# Patient Record
Sex: Female | Born: 1985 | Race: White | Hispanic: No | Marital: Single | Smoking: Current every day smoker
Health system: Southern US, Community
[De-identification: ages and names within clinical notes are randomized; demographics above are authoritative.]

## PROBLEM LIST (undated history)

## (undated) DIAGNOSIS — A749 Chlamydial infection, unspecified: Secondary | ICD-10-CM

## (undated) DIAGNOSIS — F340 Cyclothymic disorder: Secondary | ICD-10-CM

## (undated) DIAGNOSIS — B977 Papillomavirus as the cause of diseases classified elsewhere: Secondary | ICD-10-CM

## (undated) DIAGNOSIS — F419 Anxiety disorder, unspecified: Secondary | ICD-10-CM

## (undated) DIAGNOSIS — R87629 Unspecified abnormal cytological findings in specimens from vagina: Secondary | ICD-10-CM

---
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
## 2007-01-22 ENCOUNTER — Emergency Department (HOSPITAL_COMMUNITY): Admission: EM | Admit: 2007-01-22 | Discharge: 2007-01-22 | Payer: Self-pay | Admitting: Emergency Medicine

## 2007-07-15 ENCOUNTER — Ambulatory Visit (HOSPITAL_COMMUNITY): Admission: RE | Admit: 2007-07-15 | Discharge: 2007-07-15 | Payer: Self-pay | Admitting: Family Medicine

## 2007-08-17 ENCOUNTER — Ambulatory Visit (HOSPITAL_COMMUNITY): Admission: RE | Admit: 2007-08-17 | Discharge: 2007-08-17 | Payer: Self-pay | Admitting: Family Medicine

## 2007-09-07 ENCOUNTER — Ambulatory Visit (HOSPITAL_COMMUNITY): Admission: RE | Admit: 2007-09-07 | Discharge: 2007-09-07 | Payer: Self-pay | Admitting: Family Medicine

## 2007-09-16 ENCOUNTER — Ambulatory Visit (HOSPITAL_COMMUNITY): Admission: RE | Admit: 2007-09-16 | Discharge: 2007-09-16 | Payer: Self-pay | Admitting: Family Medicine

## 2007-11-04 DIAGNOSIS — A749 Chlamydial infection, unspecified: Secondary | ICD-10-CM

## 2007-11-04 HISTORY — DX: Chlamydial infection, unspecified: A74.9

## 2007-12-10 ENCOUNTER — Ambulatory Visit: Payer: Self-pay | Admitting: Physician Assistant

## 2007-12-10 ENCOUNTER — Inpatient Hospital Stay (HOSPITAL_COMMUNITY): Admission: AD | Admit: 2007-12-10 | Discharge: 2007-12-10 | Payer: Self-pay | Admitting: Family Medicine

## 2008-02-06 ENCOUNTER — Inpatient Hospital Stay (HOSPITAL_COMMUNITY): Admission: AD | Admit: 2008-02-06 | Discharge: 2008-02-08 | Payer: Self-pay | Admitting: Obstetrics & Gynecology

## 2008-02-06 ENCOUNTER — Ambulatory Visit: Payer: Self-pay | Admitting: Obstetrics and Gynecology

## 2008-03-06 ENCOUNTER — Emergency Department (HOSPITAL_COMMUNITY): Admission: EM | Admit: 2008-03-06 | Discharge: 2008-03-07 | Payer: Self-pay | Admitting: Emergency Medicine

## 2008-03-07 ENCOUNTER — Inpatient Hospital Stay (HOSPITAL_COMMUNITY): Admission: AD | Admit: 2008-03-07 | Discharge: 2008-03-07 | Payer: Self-pay | Admitting: Gynecology

## 2008-05-14 ENCOUNTER — Inpatient Hospital Stay (HOSPITAL_COMMUNITY): Admission: AD | Admit: 2008-05-14 | Discharge: 2008-05-15 | Payer: Self-pay | Admitting: *Deleted

## 2008-05-14 ENCOUNTER — Ambulatory Visit: Payer: Self-pay | Admitting: *Deleted

## 2008-05-14 ENCOUNTER — Inpatient Hospital Stay (HOSPITAL_COMMUNITY): Admission: EM | Admit: 2008-05-14 | Discharge: 2008-05-14 | Payer: Self-pay | Admitting: Emergency Medicine

## 2009-01-09 ENCOUNTER — Emergency Department (HOSPITAL_COMMUNITY): Admission: EM | Admit: 2009-01-09 | Discharge: 2009-01-09 | Payer: Self-pay | Admitting: Emergency Medicine

## 2009-04-02 ENCOUNTER — Inpatient Hospital Stay (HOSPITAL_COMMUNITY): Admission: AD | Admit: 2009-04-02 | Discharge: 2009-04-02 | Payer: Self-pay | Admitting: Obstetrics & Gynecology

## 2009-04-04 ENCOUNTER — Inpatient Hospital Stay (HOSPITAL_COMMUNITY): Admission: AD | Admit: 2009-04-04 | Discharge: 2009-04-04 | Payer: Self-pay | Admitting: Obstetrics & Gynecology

## 2009-06-03 IMAGING — US US OB COMP LESS 14 WK
1 series · 14 of 28 positions shown · non-contrast
Comparison: none

OBSTETRICAL ULTRASOUND:

 This ultrasound exam was performed in the [HOSPITAL] Ultrasound Department.  The OB US report was generated in the AS system, and faxed to the ordering physician.  This report is also available in [REDACTED] PACS.

[Series 1: us ob comp less 14 wk · 0.18mm/px · 44 acquisitions, 14 frames shown]
[im 2/44]
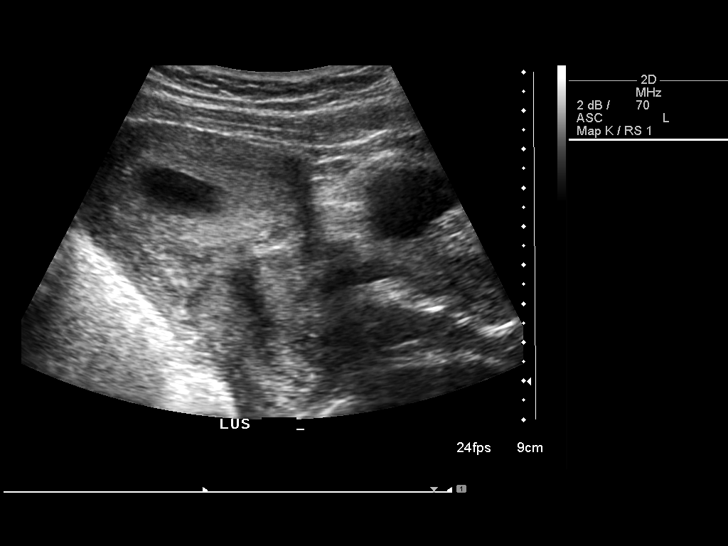
[im 5/44]
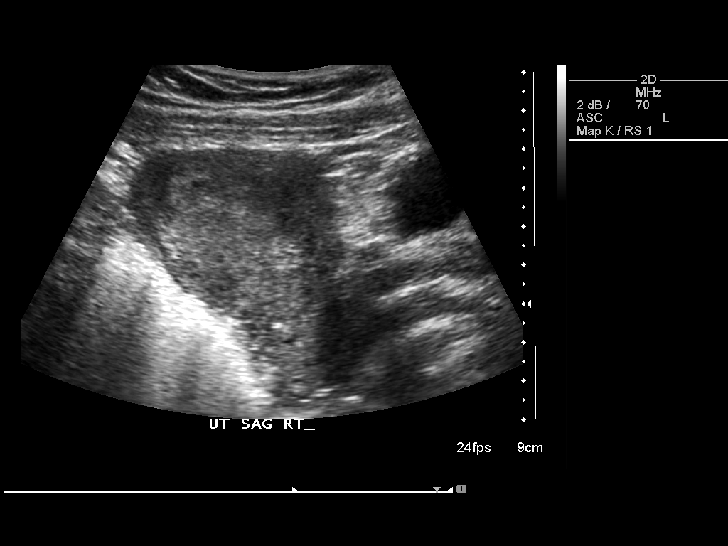
[im 8/44]
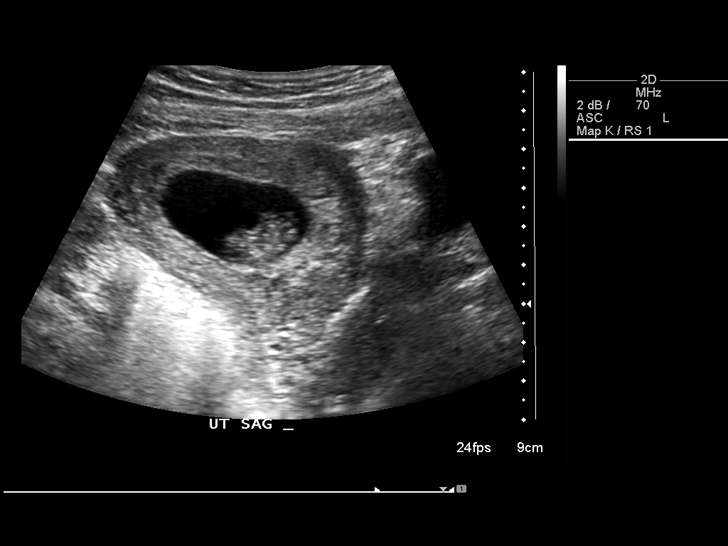
[im 12/44]
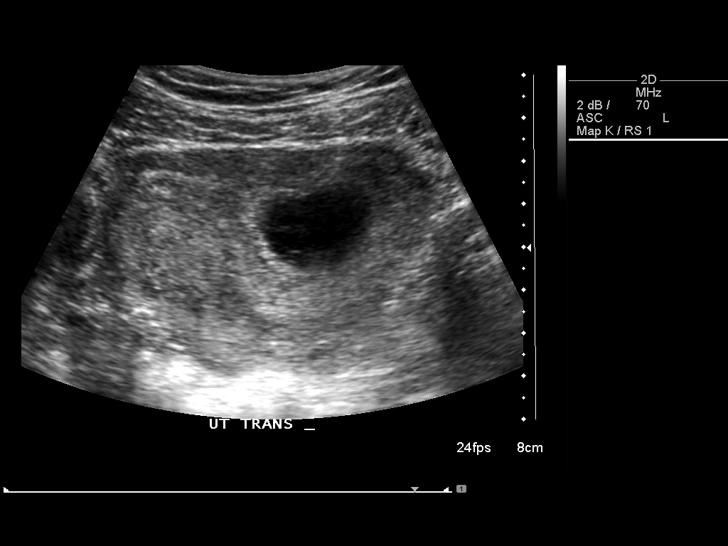
[im 15/44]
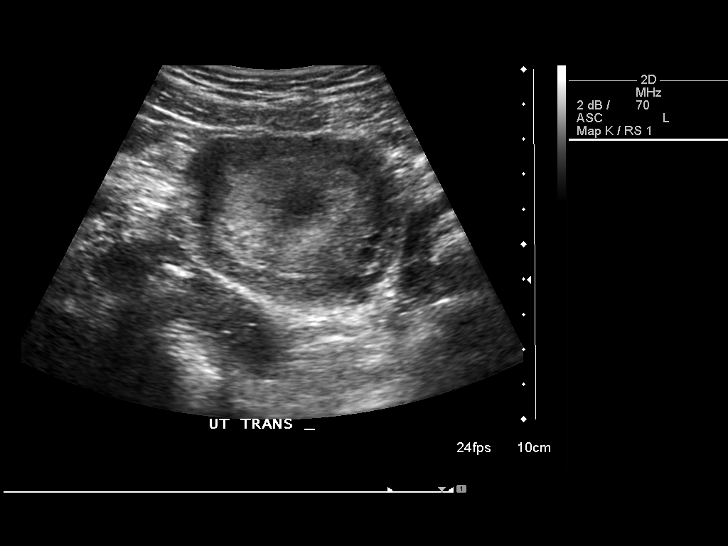
[im 18/44]
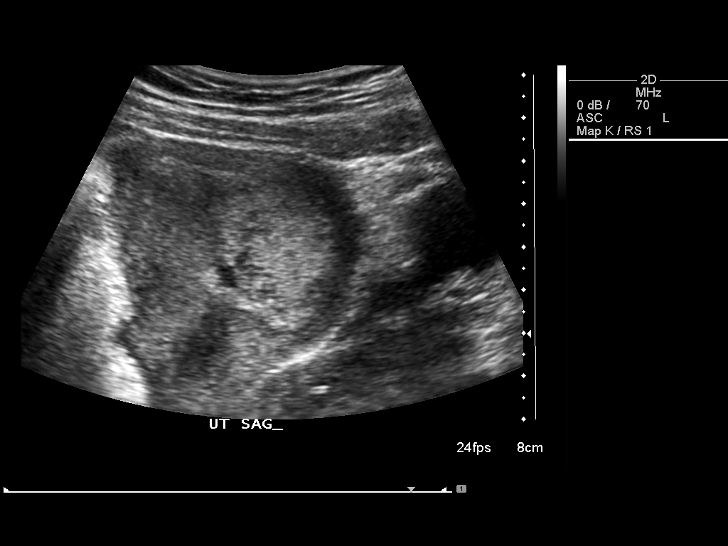
[im 21/44]
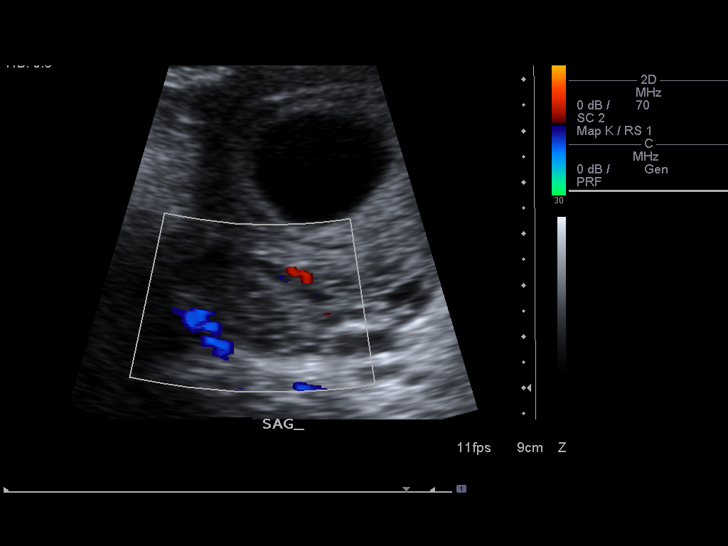
[im 24/44]
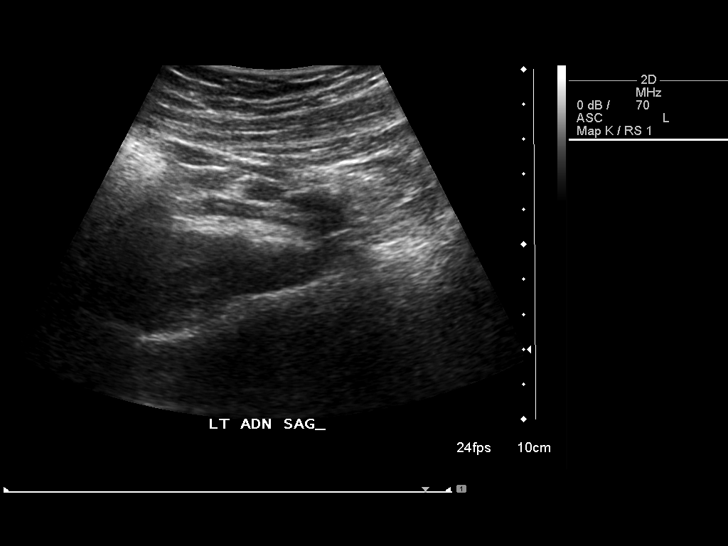
[im 28/44]
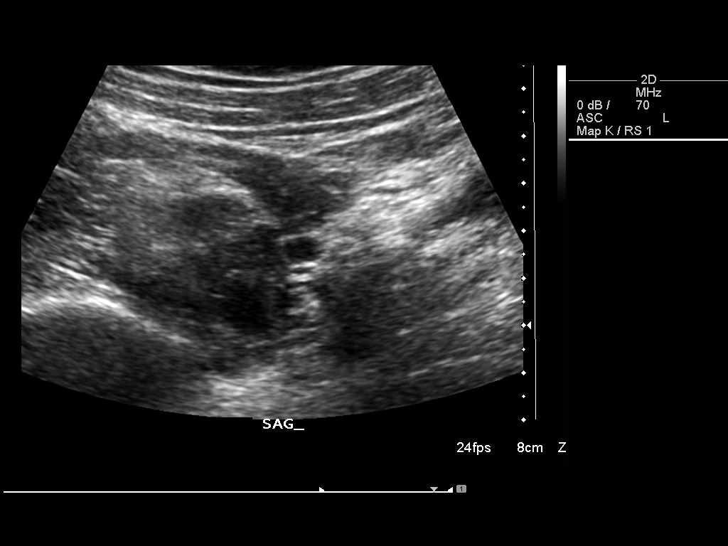
[im 31/44]
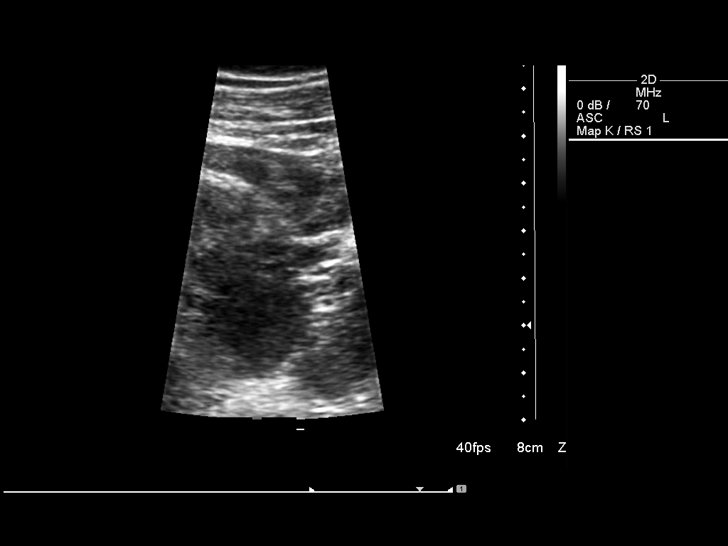
[im 34/44]
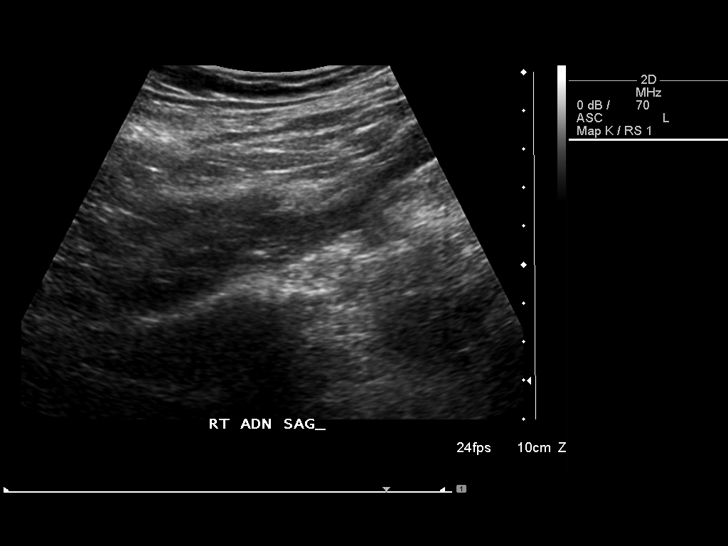
[im 37/44]
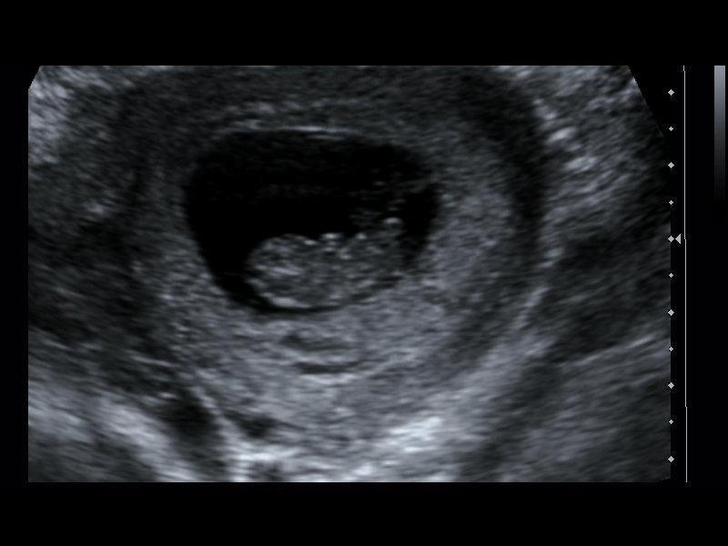
[im 40/44]
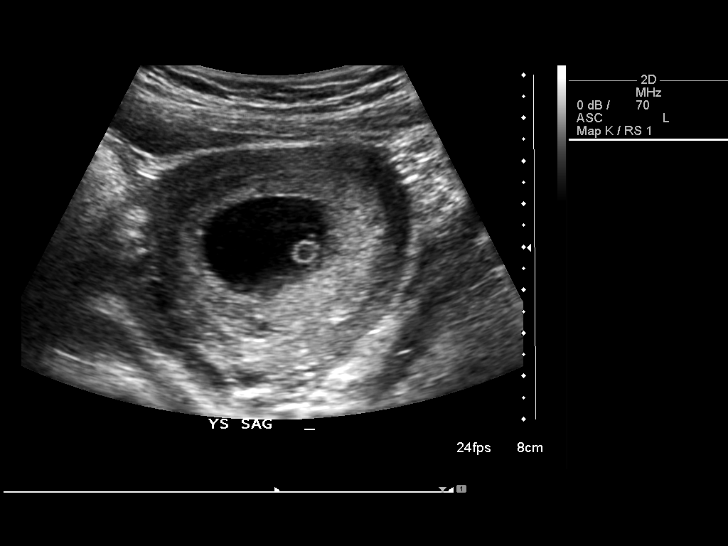
[im 44/44]
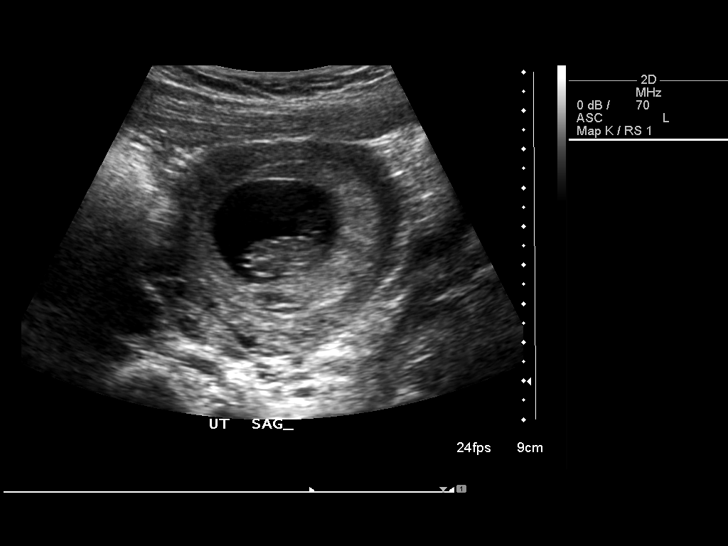

[14 of 28 positions shown; findings below may reference images not displayed]

IMPRESSION: See AS Obstetric US report.

## 2010-01-17 ENCOUNTER — Inpatient Hospital Stay (HOSPITAL_COMMUNITY): Admission: AD | Admit: 2010-01-17 | Discharge: 2010-01-17 | Payer: Self-pay | Admitting: Obstetrics and Gynecology

## 2010-02-21 ENCOUNTER — Inpatient Hospital Stay (HOSPITAL_COMMUNITY): Admission: AD | Admit: 2010-02-21 | Discharge: 2010-02-21 | Payer: Self-pay | Admitting: Obstetrics and Gynecology

## 2010-03-24 ENCOUNTER — Inpatient Hospital Stay (HOSPITAL_COMMUNITY): Admission: AD | Admit: 2010-03-24 | Discharge: 2010-03-26 | Payer: Self-pay | Admitting: Obstetrics and Gynecology

## 2010-05-21 ENCOUNTER — Emergency Department (HOSPITAL_COMMUNITY): Admission: EM | Admit: 2010-05-21 | Discharge: 2010-05-21 | Payer: Self-pay | Admitting: Family Medicine

## 2011-01-05 ENCOUNTER — Emergency Department (HOSPITAL_COMMUNITY)
Admission: EM | Admit: 2011-01-05 | Discharge: 2011-01-05 | Disposition: A | Discharge status: Home / Self Care | Payer: Medicaid Other | Attending: Emergency Medicine | Admitting: Emergency Medicine

## 2011-01-05 ENCOUNTER — Emergency Department (HOSPITAL_COMMUNITY): Payer: Medicaid Other

## 2011-01-05 DIAGNOSIS — R5381 Other malaise: Secondary | ICD-10-CM | POA: Insufficient documentation

## 2011-01-05 DIAGNOSIS — K5289 Other specified noninfective gastroenteritis and colitis: Secondary | ICD-10-CM | POA: Insufficient documentation

## 2011-01-05 DIAGNOSIS — R197 Diarrhea, unspecified: Secondary | ICD-10-CM | POA: Insufficient documentation

## 2011-01-05 DIAGNOSIS — F3289 Other specified depressive episodes: Secondary | ICD-10-CM | POA: Insufficient documentation

## 2011-01-05 DIAGNOSIS — R059 Cough, unspecified: Secondary | ICD-10-CM | POA: Insufficient documentation

## 2011-01-05 DIAGNOSIS — J40 Bronchitis, not specified as acute or chronic: Secondary | ICD-10-CM | POA: Insufficient documentation

## 2011-01-05 DIAGNOSIS — R109 Unspecified abdominal pain: Secondary | ICD-10-CM | POA: Insufficient documentation

## 2011-01-05 DIAGNOSIS — R0982 Postnasal drip: Secondary | ICD-10-CM | POA: Insufficient documentation

## 2011-01-05 DIAGNOSIS — B9789 Other viral agents as the cause of diseases classified elsewhere: Secondary | ICD-10-CM | POA: Insufficient documentation

## 2011-01-05 DIAGNOSIS — F329 Major depressive disorder, single episode, unspecified: Secondary | ICD-10-CM | POA: Insufficient documentation

## 2011-01-05 DIAGNOSIS — R51 Headache: Secondary | ICD-10-CM | POA: Insufficient documentation

## 2011-01-05 DIAGNOSIS — R300 Dysuria: Secondary | ICD-10-CM | POA: Insufficient documentation

## 2011-01-05 DIAGNOSIS — R509 Fever, unspecified: Secondary | ICD-10-CM | POA: Insufficient documentation

## 2011-01-05 DIAGNOSIS — R000 Tachycardia, unspecified: Secondary | ICD-10-CM | POA: Insufficient documentation

## 2011-01-05 DIAGNOSIS — R05 Cough: Secondary | ICD-10-CM | POA: Insufficient documentation

## 2011-01-05 DIAGNOSIS — J3489 Other specified disorders of nose and nasal sinuses: Secondary | ICD-10-CM | POA: Insufficient documentation

## 2011-01-05 DIAGNOSIS — R112 Nausea with vomiting, unspecified: Secondary | ICD-10-CM | POA: Insufficient documentation

## 2011-01-18 LAB — POCT URINALYSIS DIP (DEVICE)
Bilirubin Urine: NEGATIVE
Glucose, UA: NEGATIVE mg/dL
Ketones, ur: NEGATIVE mg/dL
Nitrite: POSITIVE — AB
Protein, ur: 100 mg/dL — AB
Specific Gravity, Urine: 1.02 (ref 1.005–1.030)
Urobilinogen, UA: 1 mg/dL (ref 0.0–1.0)
pH: 7 (ref 5.0–8.0)

## 2011-01-18 LAB — POCT PREGNANCY, URINE: Preg Test, Ur: NEGATIVE

## 2011-01-20 LAB — RPR: RPR Ser Ql: NONREACTIVE

## 2011-01-20 LAB — CBC
HCT: 32.4 % — ABNORMAL LOW (ref 36.0–46.0)
HCT: 36.2 % (ref 36.0–46.0)
Hemoglobin: 11.1 g/dL — ABNORMAL LOW (ref 12.0–15.0)
Hemoglobin: 12.3 g/dL (ref 12.0–15.0)
MCHC: 34 g/dL (ref 30.0–36.0)
MCHC: 34.3 g/dL (ref 30.0–36.0)
MCV: 81.8 fL (ref 78.0–100.0)
MCV: 81.8 fL (ref 78.0–100.0)
Platelets: 153 10*3/uL (ref 150–400)
Platelets: 184 10*3/uL (ref 150–400)
RBC: 3.97 MIL/uL (ref 3.87–5.11)
RBC: 4.43 MIL/uL (ref 3.87–5.11)
RDW: 14.4 % (ref 11.5–15.5)
RDW: 14.7 % (ref 11.5–15.5)
WBC: 11.1 10*3/uL — ABNORMAL HIGH (ref 4.0–10.5)
WBC: 13.6 10*3/uL — ABNORMAL HIGH (ref 4.0–10.5)

## 2011-01-26 LAB — URINE MICROSCOPIC-ADD ON

## 2011-01-26 LAB — URINALYSIS, ROUTINE W REFLEX MICROSCOPIC
Bilirubin Urine: NEGATIVE
Glucose, UA: NEGATIVE mg/dL
Hgb urine dipstick: NEGATIVE
Ketones, ur: NEGATIVE mg/dL
Nitrite: NEGATIVE
Protein, ur: NEGATIVE mg/dL
Specific Gravity, Urine: 1.025 (ref 1.005–1.030)
Urobilinogen, UA: 0.2 mg/dL (ref 0.0–1.0)
pH: 6 (ref 5.0–8.0)

## 2011-01-26 LAB — URINE CULTURE

## 2011-02-10 ENCOUNTER — Emergency Department (HOSPITAL_COMMUNITY)
Admission: EM | Admit: 2011-02-10 | Discharge: 2011-02-10 | Disposition: A | Discharge status: Home / Self Care | Payer: Medicaid Other | Attending: Emergency Medicine | Admitting: Emergency Medicine

## 2011-02-10 DIAGNOSIS — F3289 Other specified depressive episodes: Secondary | ICD-10-CM | POA: Insufficient documentation

## 2011-02-10 DIAGNOSIS — H9209 Otalgia, unspecified ear: Secondary | ICD-10-CM | POA: Insufficient documentation

## 2011-02-10 DIAGNOSIS — F329 Major depressive disorder, single episode, unspecified: Secondary | ICD-10-CM | POA: Insufficient documentation

## 2011-02-10 DIAGNOSIS — R059 Cough, unspecified: Secondary | ICD-10-CM | POA: Insufficient documentation

## 2011-02-10 DIAGNOSIS — R0982 Postnasal drip: Secondary | ICD-10-CM | POA: Insufficient documentation

## 2011-02-10 DIAGNOSIS — J3489 Other specified disorders of nose and nasal sinuses: Secondary | ICD-10-CM | POA: Insufficient documentation

## 2011-02-10 DIAGNOSIS — R070 Pain in throat: Secondary | ICD-10-CM | POA: Insufficient documentation

## 2011-02-10 DIAGNOSIS — R05 Cough: Secondary | ICD-10-CM | POA: Insufficient documentation

## 2011-02-10 DIAGNOSIS — B9789 Other viral agents as the cause of diseases classified elsewhere: Secondary | ICD-10-CM | POA: Insufficient documentation

## 2011-02-10 LAB — RAPID STREP SCREEN (MED CTR MEBANE ONLY): Streptococcus, Group A Screen (Direct): NEGATIVE

## 2011-02-11 LAB — URINE MICROSCOPIC-ADD ON

## 2011-02-11 LAB — CBC
HCT: 39.6 % (ref 36.0–46.0)
Hemoglobin: 14 g/dL (ref 12.0–15.0)
MCV: 85.2 fL (ref 78.0–100.0)
RBC: 4.65 MIL/uL (ref 3.87–5.11)
WBC: 6.1 10*3/uL (ref 4.0–10.5)

## 2011-02-11 LAB — WET PREP, GENITAL: Yeast Wet Prep HPF POC: NONE SEEN

## 2011-02-11 LAB — DIFFERENTIAL
Eosinophils Absolute: 0.3 10*3/uL (ref 0.0–0.7)
Eosinophils Relative: 5 % (ref 0–5)
Lymphs Abs: 2.1 10*3/uL (ref 0.7–4.0)
Monocytes Absolute: 0.6 10*3/uL (ref 0.1–1.0)
Monocytes Relative: 9 % (ref 3–12)

## 2011-02-11 LAB — URINALYSIS, ROUTINE W REFLEX MICROSCOPIC
Glucose, UA: NEGATIVE mg/dL
Nitrite: NEGATIVE
Specific Gravity, Urine: >1.03 — ABNORMAL HIGH (ref 1.005–1.030)
pH: 6 (ref 5.0–8.0)

## 2011-03-13 ENCOUNTER — Emergency Department (HOSPITAL_COMMUNITY)
Admission: EM | Admit: 2011-03-13 | Discharge: 2011-03-13 | Disposition: A | Discharge status: Home / Self Care | Payer: Medicaid Other | Attending: Emergency Medicine | Admitting: Emergency Medicine

## 2011-03-13 DIAGNOSIS — M5430 Sciatica, unspecified side: Secondary | ICD-10-CM | POA: Insufficient documentation

## 2011-03-13 DIAGNOSIS — M545 Low back pain, unspecified: Secondary | ICD-10-CM | POA: Insufficient documentation

## 2011-03-13 DIAGNOSIS — M79609 Pain in unspecified limb: Secondary | ICD-10-CM | POA: Insufficient documentation

## 2011-03-13 DIAGNOSIS — F329 Major depressive disorder, single episode, unspecified: Secondary | ICD-10-CM | POA: Insufficient documentation

## 2011-03-13 DIAGNOSIS — F3289 Other specified depressive episodes: Secondary | ICD-10-CM | POA: Insufficient documentation
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
## 2011-03-31 ENCOUNTER — Emergency Department (HOSPITAL_COMMUNITY)
Admission: EM | Admit: 2011-03-31 | Discharge: 2011-03-31 | Disposition: A | Discharge status: Home / Self Care | Payer: Medicaid Other | Attending: Emergency Medicine | Admitting: Emergency Medicine

## 2011-03-31 DIAGNOSIS — R109 Unspecified abdominal pain: Secondary | ICD-10-CM | POA: Insufficient documentation

## 2011-03-31 DIAGNOSIS — F329 Major depressive disorder, single episode, unspecified: Secondary | ICD-10-CM | POA: Insufficient documentation

## 2011-03-31 DIAGNOSIS — F3289 Other specified depressive episodes: Secondary | ICD-10-CM | POA: Insufficient documentation

## 2011-03-31 DIAGNOSIS — M79609 Pain in unspecified limb: Secondary | ICD-10-CM | POA: Insufficient documentation

## 2011-03-31 DIAGNOSIS — M25559 Pain in unspecified hip: Secondary | ICD-10-CM | POA: Insufficient documentation

## 2011-07-25 LAB — URINALYSIS, ROUTINE W REFLEX MICROSCOPIC
Bilirubin Urine: NEGATIVE
Glucose, UA: NEGATIVE
Hgb urine dipstick: NEGATIVE
Ketones, ur: NEGATIVE
pH: 6.5

## 2011-07-29 LAB — CBC
HCT: 32.9 — ABNORMAL LOW
Hemoglobin: 11.4 — ABNORMAL LOW
MCV: 80.2
RDW: 13.1

## 2011-07-31 LAB — ACETAMINOPHEN LEVEL
Acetaminophen (Tylenol), Serum: 42.5 — ABNORMAL HIGH
Acetaminophen (Tylenol), Serum: 52.2 — ABNORMAL HIGH
Acetaminophen (Tylenol), Serum: <10 — ABNORMAL LOW

## 2011-07-31 LAB — BASIC METABOLIC PANEL
BUN: 10
Calcium: 8.9
Calcium: 9.1
Creatinine, Ser: 0.59
Creatinine, Ser: 0.64
GFR calc Af Amer: >60
GFR calc non Af Amer: >60
GFR calc non Af Amer: >60
Glucose, Bld: 95
Glucose, Bld: 95
Sodium: 138

## 2011-07-31 LAB — CBC
Hemoglobin: 13
RBC: 4.58

## 2011-07-31 LAB — RAPID URINE DRUG SCREEN, HOSP PERFORMED
Amphetamines: NOT DETECTED
Cocaine: NOT DETECTED
Opiates: NOT DETECTED
Tetrahydrocannabinol: NOT DETECTED

## 2011-07-31 LAB — HEPATIC FUNCTION PANEL
ALT: 25
Bilirubin, Direct: 0.1
Indirect Bilirubin: 0.4

## 2011-07-31 LAB — COMPREHENSIVE METABOLIC PANEL
ALT: 28
Alkaline Phosphatase: 85
CO2: 22
GFR calc non Af Amer: >60
Glucose, Bld: 106 — ABNORMAL HIGH
Potassium: 3.2 — ABNORMAL LOW
Sodium: 142

## 2011-07-31 LAB — DIFFERENTIAL
Basophils Relative: 0
Eosinophils Absolute: 0.1
Neutrophils Relative %: 51

## 2011-07-31 LAB — ETHANOL: Alcohol, Ethyl (B): 200 — ABNORMAL HIGH

## 2011-07-31 LAB — SALICYLATE LEVEL: Salicylate Lvl: <4

## 2011-07-31 LAB — URINE CULTURE: Colony Count: NO GROWTH

## 2013-01-16 ENCOUNTER — Encounter (HOSPITAL_COMMUNITY): Payer: Self-pay

## 2013-01-16 ENCOUNTER — Emergency Department (INDEPENDENT_AMBULATORY_CARE_PROVIDER_SITE_OTHER)
Admission: EM | Admit: 2013-01-16 | Discharge: 2013-01-16 | Disposition: A | Discharge status: Home / Self Care | Payer: Self-pay | Source: Home / Self Care | Attending: Emergency Medicine | Admitting: Emergency Medicine

## 2013-01-16 DIAGNOSIS — L509 Urticaria, unspecified: Secondary | ICD-10-CM

## 2013-01-16 MED ORDER — PREDNISONE 20 MG PO TABS: 40.0000 mg | ORAL_TABLET | Freq: Every day | ORAL | Status: DC

## 2013-01-16 MED ORDER — PREDNISONE 20 MG PO TABS: 60.0000 mg | ORAL_TABLET | Freq: Once | ORAL | Status: DC

## 2013-01-16 NOTE — ED Provider Notes (Signed)
History     CSN: [id number]  Arrival date & time 01/16/13  1242   First MD Initiated Contact with Patient 01/16/13 1337      Chief Complaint  Patient presents with  . Rash    (Consider location/radiation/quality/duration/timing/severity/associated sxs/prior treatment) Patient is a 27 y.o. female presenting with rash. The history is provided by the patient.  Rash Location:  Head/neck, hand, shoulder/arm and leg Head/neck rash location:  L ear and L neck Shoulder/arm rash location:  L upper arm, R forearm and L forearm Leg rash location:  R leg Quality: itchiness and redness   Onset quality:  Sudden Duration:  3 days Timing:  Constant Progression:  Worsening Chronicity:  New Context: not animal contact, not chemical exposure, not food, not medications, not new detergent/soap and not plant contact   Relieved by:  Nothing Ineffective treatments:  Antihistamines Associated symptoms: no fever, no shortness of breath, no throat swelling and no tongue swelling     History reviewed. No pertinent past medical history.  History reviewed. No pertinent past surgical history.  No family history on file.  History  Substance Use Topics  . Smoking status: Current Every Day Smoker  . Smokeless tobacco: Not on file  . Alcohol Use: Yes    OB History   Grav Para Term Preterm Abortions TAB SAB Ect Mult Living                  Review of Systems  Constitutional: Negative for fever.  Respiratory: Negative for shortness of breath.   Skin: Positive for rash.  All other systems reviewed and are negative.  Onset of itchy rash 3 days ago that has worsened. No known new exposures. Never had before. Denies any facial swelling or respiratory sx's.  Allergies  Codeine  Home Medications  No current outpatient prescriptions on file.  BP 119/72  Pulse 72  Temp(Src) 98.9 F (37.2 C) (Oral)  Resp 14  SpO2 99%  Physical Exam  Constitutional: She is oriented to person, place, and  time. She appears well-developed and well-nourished.  HENT:  Head: Normocephalic and atraumatic.  Eyes: Conjunctivae are normal.  Neck: Neck supple.  Cardiovascular: Normal rate and regular rhythm.   Pulmonary/Chest: Effort normal and breath sounds normal.  Musculoskeletal: Normal range of motion.  Neurological: She is alert and oriented to person, place, and time.  Skin: Skin is warm and dry.  Raised, erythematous patches (geographic shaped) noted to BUE's, (R) upper leg and (L) neck and (L) ear c/w urticarial rash   Psychiatric: She has a normal mood and affect.    ED Course  Procedures (including critical care time)  Labs Reviewed - No data to display No results found.   No diagnosis found.    MDM  3 day h/o pruritic rash to BUE's (R) upper leg and (L) neck and (L) ear. No new known exposures. Severe itching. Can't sleep at night d/t itching. Will treat w/ Prednisone, Pepcid and Benadryl. Pt to f/u w/ dermatology if rash not improving or worsens.         Leanne Chang, NP 01/16/13 1535

## 2013-01-16 NOTE — ED Notes (Signed)
Patient states woke up with rash approx 3 days ago and has gotten worse since, on arms, neck and left ear area

## 2013-01-16 NOTE — ED Provider Notes (Signed)
Medical screening examination/treatment/procedure(s) were performed by non-physician practitioner and as supervising physician I was immediately available for consultation/collaboration.  Leslee Home, M.D.  Reuben Likes, MD 01/16/13 2035

## 2013-02-27 ENCOUNTER — Emergency Department (HOSPITAL_COMMUNITY)
Admission: EM | Admit: 2013-02-27 | Discharge: 2013-02-27 | Disposition: A | Discharge status: Home / Self Care | Payer: Self-pay | Attending: Emergency Medicine | Admitting: Emergency Medicine

## 2013-02-27 ENCOUNTER — Encounter (HOSPITAL_COMMUNITY): Payer: Self-pay | Admitting: Adult Health

## 2013-02-27 ENCOUNTER — Emergency Department (HOSPITAL_COMMUNITY): Payer: Self-pay

## 2013-02-27 DIAGNOSIS — W010XXA Fall on same level from slipping, tripping and stumbling without subsequent striking against object, initial encounter: Secondary | ICD-10-CM | POA: Insufficient documentation

## 2013-02-27 DIAGNOSIS — Z23 Encounter for immunization: Secondary | ICD-10-CM | POA: Insufficient documentation

## 2013-02-27 DIAGNOSIS — F17200 Nicotine dependence, unspecified, uncomplicated: Secondary | ICD-10-CM | POA: Insufficient documentation

## 2013-02-27 DIAGNOSIS — IMO0002 Reserved for concepts with insufficient information to code with codable children: Secondary | ICD-10-CM | POA: Insufficient documentation

## 2013-02-27 DIAGNOSIS — Z79899 Other long term (current) drug therapy: Secondary | ICD-10-CM | POA: Insufficient documentation

## 2013-02-27 DIAGNOSIS — Y929 Unspecified place or not applicable: Secondary | ICD-10-CM | POA: Insufficient documentation

## 2013-02-27 DIAGNOSIS — S335XXA Sprain of ligaments of lumbar spine, initial encounter: Secondary | ICD-10-CM | POA: Insufficient documentation

## 2013-02-27 DIAGNOSIS — S93401A Sprain of unspecified ligament of right ankle, initial encounter: Secondary | ICD-10-CM

## 2013-02-27 DIAGNOSIS — S39012A Strain of muscle, fascia and tendon of lower back, initial encounter: Secondary | ICD-10-CM

## 2013-02-27 DIAGNOSIS — X500XXA Overexertion from strenuous movement or load, initial encounter: Secondary | ICD-10-CM | POA: Insufficient documentation

## 2013-02-27 DIAGNOSIS — S93409A Sprain of unspecified ligament of unspecified ankle, initial encounter: Secondary | ICD-10-CM | POA: Insufficient documentation

## 2013-02-27 DIAGNOSIS — Y9389 Activity, other specified: Secondary | ICD-10-CM | POA: Insufficient documentation

## 2013-02-27 MED ORDER — TETANUS-DIPHTH-ACELL PERTUSSIS 5-2.5-18.5 LF-MCG/0.5 IM SUSP
0.5000 mL | Freq: Once | INTRAMUSCULAR | Status: AC
  Administered 2013-02-27: 0.5 mL via INTRAMUSCULAR
  Filled 2013-02-27: qty 0.5

## 2013-02-27 MED ORDER — DIAZEPAM 5 MG PO TABS
5.0000 mg | ORAL_TABLET | Freq: Once | ORAL | Status: AC
  Administered 2013-02-27: 5 mg via ORAL
  Filled 2013-02-27: qty 1

## 2013-02-27 MED ORDER — DIAZEPAM 5 MG PO TABS: 5.0000 mg | ORAL_TABLET | Freq: Two times a day (BID) | ORAL | Status: DC
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
## 2013-02-27 NOTE — ED Provider Notes (Signed)
History    This chart was scribed for [name], a non-physician practitioner working with [name], MD by [name], ED Scribe. This patient was seen in room TR10C/TR10C and the patient's care was started at 1750.     CSN: [id number]  Arrival date & time 02/27/13  1446   First MD Initiated Contact with Patient 02/27/13 1717      Chief Complaint  Patient presents with  . Ankle Pain    (Consider location/radiation/quality/duration/timing/severity/associated sxs/prior treatment) The history is provided by the patient.  [name] is a 27 y.o. female who presents to the Emergency Department complaining of superficial abrasion and constant mild swelling of right ankle onset last night after stumbling through a screen door. Pt reports pain is 8/10 in severity with weight bearing and mildly relieved at rest. Pt reports trying neosporin with no relief of symptoms. Pt denies taking any medications to treat pain. Pt reports tetanus is not up to date.  Also complaining of low back pain s/p falling on a slip-and-slide 2 days ago. Pain located lower back described as dull and achy. Denies pain, numbness or tingling down extremities. No loss of control of bowels or bladder or saddle anesthesia.  History reviewed. No pertinent past medical history.  History reviewed. No pertinent past surgical history.  History reviewed. No pertinent family history.  History  Substance Use Topics  . Smoking status: Current Every Day Smoker  . Smokeless tobacco: Not on file  . Alcohol Use: Yes    OB History   Grav Para Term Preterm Abortions TAB SAB Ect Mult Living                  Review of Systems A complete 10 system review of systems was obtained and all systems are negative except as noted in the HPI and PMH.    Allergies  Codeine  Home Medications   Current Outpatient Rx  Name  Route  Sig  Dispense  Refill  . Etonogestrel (IMPLANON [place])   Subcutaneous   Inject into  the skin.         Marland Kitchen ibuprofen (ADVIL,MOTRIN) 200 MG tablet   Oral   Take 400 mg by mouth every 6 (six) hours as needed for headache.           BP 132/90  Pulse 99  Temp(Src) 99 F (37.2 C) (Oral)  Resp 16  SpO2 99%  Physical Exam  Nursing note and vitals reviewed. Constitutional: She is oriented to person, place, and time. She appears well-developed and well-nourished. No distress.  HENT:  Head: Normocephalic and atraumatic.  Eyes: EOM are normal.  Neck: Neck supple. No tracheal deviation present.  Cardiovascular: Normal rate, regular rhythm, normal heart sounds and intact distal pulses.   Pulmonary/Chest: Effort normal. No respiratory distress.  Musculoskeletal: Normal range of motion. She exhibits edema.       Left ankle: She exhibits swelling (mild swelling). She exhibits normal pulse. Tenderness. Lateral malleolus tenderness found. Achilles tendon normal. Achilles tendon exhibits normal Thompson's test results.       Lumbar back: She exhibits tenderness (bilateral paraspinous muscles ).  Neurological: She is alert and oriented to person, place, and time.  Skin: Skin is warm and dry.  2 cm superficial abrasion on lateral malleolus  Psychiatric: She has a normal mood and affect. Her behavior is normal.    ED Course  Procedures (including critical care time) Medications  TDaP (BOOSTRIX) injection 0.5 mL (not administered)  diazepam (VALIUM) tablet 5 mg (not administered)    Labs Reviewed - No data to display Dg Ankle Complete Right  02/27/2013  *RADIOLOGY REPORT*  Clinical Data:  Fall.  Ankle injury  RIGHT ANKLE - COMPLETE 3+ VIEW  Comparison:  None.  Findings:  There is no evidence of fracture, dislocation, or joint effusion.  There is no evidence of arthropathy or other focal bone abnormality.  Soft tissues are unremarkable.  IMPRESSION: Negative.   Original Report Authenticated By: Janeece Riggers, M.D.      1. Ankle sprain, right, initial encounter   2. Back  strain, initial encounter       MDM  27 y/o female with right ankle sprain and low back strain. Ace wrap given for ankle along with crutches. No red flags concerning patient's back pain. No focal neurologic deficits. Neurovascularly intact. No signs of cauda equina. I will give her Valium for muscle spasm. Advised rest, ice and heat for her back, ice and elevation for her ankle. Ibuprofen advised. Return precautions discussed. Patient states understanding of plan and is agreeable.  I personally performed the services described in this documentation, which was scribed in my presence. The recorded information has been reviewed and is accurate.    Trevor Mace, PA-C 02/27/13 [phone]

## 2013-03-01 NOTE — ED Provider Notes (Signed)
Medical screening examination/treatment/procedure(s) were performed by non-physician practitioner and as supervising physician I was immediately available for consultation/collaboration.  [name], MD 03/01/13 [phone]

## 2013-07-31 ENCOUNTER — Encounter (HOSPITAL_COMMUNITY): Payer: Self-pay | Admitting: *Deleted

## 2013-07-31 ENCOUNTER — Emergency Department (HOSPITAL_COMMUNITY)
Admission: EM | Admit: 2013-07-31 | Discharge: 2013-07-31 | Disposition: A | Discharge status: Home / Self Care | Payer: Medicaid Other | Attending: Emergency Medicine | Admitting: Emergency Medicine

## 2013-07-31 ENCOUNTER — Emergency Department (HOSPITAL_COMMUNITY): Payer: Medicaid Other

## 2013-07-31 DIAGNOSIS — S20219A Contusion of unspecified front wall of thorax, initial encounter: Secondary | ICD-10-CM | POA: Insufficient documentation

## 2013-07-31 DIAGNOSIS — Z791 Long term (current) use of non-steroidal anti-inflammatories (NSAID): Secondary | ICD-10-CM | POA: Insufficient documentation

## 2013-07-31 DIAGNOSIS — Y92410 Unspecified street and highway as the place of occurrence of the external cause: Secondary | ICD-10-CM | POA: Insufficient documentation

## 2013-07-31 DIAGNOSIS — Y939 Activity, unspecified: Secondary | ICD-10-CM | POA: Insufficient documentation

## 2013-07-31 DIAGNOSIS — Z79899 Other long term (current) drug therapy: Secondary | ICD-10-CM | POA: Insufficient documentation

## 2013-07-31 DIAGNOSIS — F17200 Nicotine dependence, unspecified, uncomplicated: Secondary | ICD-10-CM | POA: Insufficient documentation

## 2013-07-31 MED ORDER — KETOROLAC TROMETHAMINE 60 MG/2ML IM SOLN
60.0000 mg | Freq: Once | INTRAMUSCULAR | Status: AC
  Administered 2013-07-31: 60 mg via INTRAMUSCULAR
  Filled 2013-07-31: qty 2

## 2013-07-31 MED ORDER — NAPROXEN 500 MG PO TABS: 500.0000 mg | ORAL_TABLET | Freq: Two times a day (BID) | ORAL | Status: DC
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
## 2014-06-19 ENCOUNTER — Inpatient Hospital Stay (HOSPITAL_COMMUNITY)
Admission: AD | Admit: 2014-06-19 | Discharge: 2014-06-19 | Disposition: A | Discharge status: Home / Self Care | Payer: Medicaid Other | Source: Ambulatory Visit | Attending: Family Medicine | Admitting: Family Medicine

## 2014-06-19 ENCOUNTER — Encounter (HOSPITAL_COMMUNITY): Payer: Self-pay | Admitting: *Deleted

## 2014-06-19 DIAGNOSIS — Z975 Presence of (intrauterine) contraceptive device: Secondary | ICD-10-CM

## 2014-06-19 DIAGNOSIS — N938 Other specified abnormal uterine and vaginal bleeding: Secondary | ICD-10-CM | POA: Diagnosis present

## 2014-06-19 DIAGNOSIS — N925 Other specified irregular menstruation: Secondary | ICD-10-CM | POA: Insufficient documentation

## 2014-06-19 DIAGNOSIS — Z309 Encounter for contraceptive management, unspecified: Secondary | ICD-10-CM

## 2014-06-19 DIAGNOSIS — N921 Excessive and frequent menstruation with irregular cycle: Secondary | ICD-10-CM

## 2014-06-19 DIAGNOSIS — N949 Unspecified condition associated with female genital organs and menstrual cycle: Secondary | ICD-10-CM | POA: Insufficient documentation

## 2014-06-19 HISTORY — DX: Unspecified abnormal cytological findings in specimens from vagina: R87.629

## 2014-06-19 HISTORY — DX: Chlamydial infection, unspecified: A74.9

## 2014-06-19 HISTORY — DX: Papillomavirus as the cause of diseases classified elsewhere: B97.7

## 2014-06-19 LAB — URINALYSIS, ROUTINE W REFLEX MICROSCOPIC
Bilirubin Urine: NEGATIVE
GLUCOSE, UA: NEGATIVE mg/dL
Ketones, ur: NEGATIVE mg/dL
LEUKOCYTES UA: NEGATIVE
Nitrite: NEGATIVE
Protein, ur: NEGATIVE mg/dL
SPECIFIC GRAVITY, URINE: 1.025 (ref 1.005–1.030)
Urobilinogen, UA: 0.2 mg/dL (ref 0.0–1.0)
pH: 6 (ref 5.0–8.0)

## 2014-06-19 LAB — POCT PREGNANCY, URINE: PREG TEST UR: NEGATIVE

## 2014-06-19 LAB — CBC
HEMATOCRIT: 40.4 % (ref 36.0–46.0)
HEMOGLOBIN: 14 g/dL (ref 12.0–15.0)
MCH: 29.9 pg (ref 26.0–34.0)
MCHC: 34.7 g/dL (ref 30.0–36.0)
MCV: 86.1 fL (ref 78.0–100.0)
Platelets: 228 10*3/uL (ref 150–400)
RBC: 4.69 MIL/uL (ref 3.87–5.11)
RDW: 13 % (ref 11.5–15.5)
WBC: 10 10*3/uL (ref 4.0–10.5)

## 2014-06-19 LAB — URINE MICROSCOPIC-ADD ON
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
## 2014-06-19 NOTE — Discharge Instructions (Signed)
Contraception Choices Contraception (birth control) is the use of any methods or devices to prevent pregnancy. Below are some methods to help avoid pregnancy. HORMONAL METHODS   Contraceptive implant. This is a thin, plastic tube containing progesterone hormone. It does not contain estrogen hormone. Your health care provider inserts the tube in the inner part of the upper arm. The tube can remain in place for up to 3 years. After 3 years, the implant must be removed. The implant prevents the ovaries from releasing an egg (ovulation), thickens the cervical mucus to prevent sperm from entering the uterus, and thins the lining of the inside of the uterus.  Progesterone-only injections. These injections are given every 3 months by your health care provider to prevent pregnancy. This synthetic progesterone hormone stops the ovaries from releasing eggs. It also thickens cervical mucus and changes the uterine lining. This makes it harder for sperm to survive in the uterus.  Birth control pills. These pills contain estrogen and progesterone hormone. They work by preventing the ovaries from releasing eggs (ovulation). They also cause the cervical mucus to thicken, preventing the sperm from entering the uterus. Birth control pills are prescribed by a health care provider.Birth control pills can also be used to treat heavy periods.  Minipill. This type of birth control pill contains only the progesterone hormone. They are taken every day of each month and must be prescribed by your health care provider.  Birth control patch. The patch contains hormones similar to those in birth control pills. It must be changed once a week and is prescribed by a health care provider.  Vaginal ring. The ring contains hormones similar to those in birth control pills. It is left in the vagina for 3 weeks, removed for 1 week, and then a new one is put back in place. The patient must be comfortable inserting and removing the ring  from the vagina.A health care provider's prescription is necessary.  Emergency contraception. Emergency contraceptives prevent pregnancy after unprotected sexual intercourse. This pill can be taken right after sex or up to 5 days after unprotected sex. It is most effective the sooner you take the pills after having sexual intercourse. Most emergency contraceptive pills are available without a prescription. Check with your pharmacist. Do not use emergency contraception as your only form of birth control. BARRIER METHODS   Female condom. This is a thin sheath (latex or rubber) that is worn over the penis during sexual intercourse. It can be used with spermicide to increase effectiveness.  Female condom. This is a soft, loose-fitting sheath that is put into the vagina before sexual intercourse.  Diaphragm. This is a soft, latex, dome-shaped barrier that must be fitted by a health care provider. It is inserted into the vagina, along with a spermicidal jelly. It is inserted before intercourse. The diaphragm should be left in the vagina for 6 to 8 hours after intercourse.  Cervical cap. This is a round, soft, latex or plastic cup that fits over the cervix and must be fitted by a health care provider. The cap can be left in place for up to 48 hours after intercourse.  Sponge. This is a soft, circular piece of polyurethane foam. The sponge has spermicide in it. It is inserted into the vagina after wetting it and before sexual intercourse.  Spermicides. These are chemicals that kill or block sperm from entering the cervix and uterus. They come in the form of creams, jellies, suppositories, foam, or tablets. They do not require a   prescription. They are inserted into the vagina with an applicator before having sexual intercourse. The process must be repeated every time you have sexual intercourse. INTRAUTERINE CONTRACEPTION  Intrauterine device (IUD). This is a T-shaped device that is put in a woman's uterus  during a menstrual period to prevent pregnancy. There are 2 types:  Copper IUD. This type of IUD is wrapped in copper wire and is placed inside the uterus. Copper makes the uterus and fallopian tubes produce a fluid that kills sperm. It can stay in place for 10 years.  Hormone IUD. This type of IUD contains the hormone progestin (synthetic progesterone). The hormone thickens the cervical mucus and prevents sperm from entering the uterus, and it also thins the uterine lining to prevent implantation of a fertilized egg. The hormone can weaken or kill the sperm that get into the uterus. It can stay in place for 3-5 years, depending on which type of IUD is used. PERMANENT METHODS OF CONTRACEPTION  Female tubal ligation. This is when the woman's fallopian tubes are surgically sealed, tied, or blocked to prevent the egg from traveling to the uterus.  Hysteroscopic sterilization. This involves placing a small coil or insert into each fallopian tube. Your doctor uses a technique called hysteroscopy to do the procedure. The device causes scar tissue to form. This results in permanent blockage of the fallopian tubes, so the sperm cannot fertilize the egg. It takes about 3 months after the procedure for the tubes to become blocked. You must use another form of birth control for these 3 months.  Female sterilization. This is when the female has the tubes that carry sperm tied off (vasectomy).This blocks sperm from entering the vagina during sexual intercourse. After the procedure, the man can still ejaculate fluid (semen). NATURAL PLANNING METHODS  Natural family planning. This is not having sexual intercourse or using a barrier method (condom, diaphragm, cervical cap) on days the woman could become pregnant.  Calendar method. This is keeping track of the length of each menstrual cycle and identifying when you are fertile.  Ovulation method. This is avoiding sexual intercourse during ovulation.  Symptothermal  method. This is avoiding sexual intercourse during ovulation, using a thermometer and ovulation symptoms.  Post-ovulation method. This is timing sexual intercourse after you have ovulated. Regardless of which type or method of contraception you choose, it is important that you use condoms to protect against the transmission of sexually transmitted infections (STIs). Talk with your health care provider about which form of contraception is most appropriate for you. Document Released: 10/20/2005 Document Revised: 10/25/2013 Document Reviewed: 04/14/2013 Exit[employer] Patient Information 2015 [employer]. This information is not intended to replace advice given to you by your health care provider. Make sure you discuss any questions you have with your health care provider.  

## 2014-06-19 NOTE — MAU Provider Note (Signed)
CC: No chief complaint on file.    First Provider Initiated Contact with Patient 06/19/14 2153      HPI [name] is a 28 y.o. G3P3 who Is here with abnormal bleeding. She has had Implanon in for 4 years with rare bleeding about once per year. She had a few days of bleeding in April then skipped May and June. She states she's been bleeding every day since 7/26 and uses about 3 pads per day; occasional small clots. Mild cramps but not at present.. She's also concerned that her hands have been shaky since last night. Worked 9 hours today and felt dizzy at times but no orthostatic symptoms now. Declines testing for STI's. Last intercourse 3 days ago. She is a smoker.   Past Medical History  Diagnosis Date  . Vaginal Pap smear, abnormal   . HPV (human papilloma virus) infection   . Chlamydia 2009    OB History  Gravida Para Term Preterm AB SAB TAB Ectopic Multiple Living  3 3        3     # Outcome Date GA Lbr Len/2nd Weight Sex Delivery Anes PTL Lv  3 PAR      SVD     2 PAR      SVD     1 PAR      SVD         History reviewed. No pertinent past surgical history.  History   Social History  . Marital Status: Single    Spouse Name: N/A    Number of Children: N/A  . Years of Education: N/A   Occupational History  . Not on file.   Social History Main Topics  . Smoking status: Current Every Day Smoker -- 1.00 packs/day  . Smokeless tobacco: Not on file  . Alcohol Use: Yes     Comment: occasional  . Drug Use: No  . Sexual Activity: Yes    Birth Control/ Protection: Implant   Other Topics Concern  . Not on file   Social History Narrative  . No narrative on file    No current facility-administered medications on file prior to encounter.   Current Outpatient Prescriptions on File Prior to Encounter  Medication Sig Dispense Refill  . ibuprofen (ADVIL,MOTRIN) 200 MG tablet Take 600 mg by mouth every 6 (six) hours as needed for headache or moderate pain (back pain).        . Etonogestrel (IMPLANON [place]) Inject into the skin.        Allergies  Allergen Reactions  . Cocoa Butter Hives and Other (See Comments)    Causes headaches  . Chocolate Rash and Other (See Comments)    Causes headaches  . Codeine Hives and Rash    ROS Pertinent items in HPI  PHYSICAL EXAM Filed Vitals:   06/19/14 2117  BP: 127/86  Pulse: 100  Temp: 99.1 F (37.3 C)  Resp: 20   General: Mildly obese female in no acute distress HEENT: no exopthalmos Cardiovascular: Borderline high rate Respiratory: Normal effort Abdomen: Soft, nontender Back: No CVAT Extremities: No edema, minimal termor Neurologic: Alert and oriented Speculum exam: NEFG; vagina with physiologic discharge, scant dark blood in vault and no active bleeding noted; cervix clean Bimanual exam: cervix closed, no CMT; uterus NSSP; no adnexal tenderness or masses   LAB RESULTS Results for orders placed during the hospital encounter of 06/19/14 (from the past 24 hour(s))  URINALYSIS, ROUTINE W REFLEX MICROSCOPIC     Status: Abnormal  Collection Time    06/19/14  9:19 PM      Result Value Ref Range   Color, Urine YELLOW  YELLOW   APPearance CLEAR  CLEAR   Specific Gravity, Urine 1.025  1.005 - 1.030   pH 6.0  5.0 - 8.0   Glucose, UA NEGATIVE  NEGATIVE mg/dL   Hgb urine dipstick LARGE (*) NEGATIVE   Bilirubin Urine NEGATIVE  NEGATIVE   Ketones, ur NEGATIVE  NEGATIVE mg/dL   Protein, ur NEGATIVE  NEGATIVE mg/dL   Urobilinogen, UA 0.2  0.0 - 1.0 mg/dL   Nitrite NEGATIVE  NEGATIVE   Leukocytes, UA NEGATIVE  NEGATIVE  URINE MICROSCOPIC-ADD ON     Status: Abnormal   Collection Time    06/19/14  9:19 PM      Result Value Ref Range   Squamous Epithelial / LPF FEW (*) RARE   WBC, UA 0-2  <3 WBC/hpf   RBC / HPF 3-6  <3 RBC/hpf   Bacteria, UA RARE  RARE   Urine-Other MUCOUS PRESENT    POCT PREGNANCY, URINE     Status: None   Collection Time    06/19/14  9:28 PM      Result Value Ref Range   Preg Test,  Ur NEGATIVE  NEGATIVE  CBC     Status: None   Collection Time    06/19/14  9:43 PM      Result Value Ref Range   WBC 10.0  4.0 - 10.5 K/uL   RBC 4.69  3.87 - 5.11 MIL/uL   Hemoglobin 14.0  12.0 - 15.0 g/dL   HCT 16.140.4  09.636.0 - 04.546.0 %   MCV 86.1  78.0 - 100.0 fL   MCH 29.9  26.0 - 34.0 pg   MCHC 34.7  30.0 - 36.0 g/dL   RDW 40.913.0  81.111.5 - 91.415.5 %   Platelets 228  150 - 400 K/uL    IMAGING No results found.  MAU COURSE TSH sent  ASSESSMENT  1. Breakthrough bleeding on Implanon   G3P3003  Implanon removal overdue  PLAN Discharge home with precautions. Contraceptive options discussed.  Tylenol for cramps up to 1000mg  po qid prn   Medication List    STOP taking these medications       ibuprofen 200 MG tablet  Commonly known as:  ADVIL,MOTRIN      TAKE these medications       IMPLANON [place]  Inject into the skin.       Follow-up Information   Follow up with [facility] In 2 weeks.   Contact information:   [address] [place] [address]       Follow up with [facility]. (Someone from Clinic will call you with appt.)    Contact information:   [address] [place] [address] [phone]     Abstain from intercourse at least 2 wks prior to appointmnent   Danae OrleansDeirdre C Brianni Manthe, CNM 06/19/2014 9:54 PM

## 2014-06-20 LAB — TSH: TSH: 2.13 u[IU]/mL (ref 0.350–4.500)

## 2014-06-20 NOTE — MAU Provider Note (Signed)
Attestation of Attending Supervision of Advanced Practitioner (PA/CNM/NP): Evaluation and management procedures were performed by the Advanced Practitioner under my supervision and collaboration.  I have reviewed the Advanced Practitioner's note and chart, and I agree with the management and plan.  PRATT,TANYA S, MD [facility] Attending 06/20/2014 9:01 AM   

## 2014-07-26 ENCOUNTER — Ambulatory Visit (INDEPENDENT_AMBULATORY_CARE_PROVIDER_SITE_OTHER): Payer: Medicaid Other | Admitting: Obstetrics and Gynecology

## 2014-07-26 ENCOUNTER — Encounter: Payer: Self-pay | Admitting: Obstetrics and Gynecology

## 2014-07-26 VITALS — BP 122/79 | HR 85 | Ht 60.0 in | Wt 166.0 lb

## 2014-07-26 DIAGNOSIS — Z30013 Encounter for initial prescription of injectable contraceptive: Secondary | ICD-10-CM

## 2014-07-26 DIAGNOSIS — Z01812 Encounter for preprocedural laboratory examination: Secondary | ICD-10-CM

## 2014-07-26 DIAGNOSIS — Z3046 Encounter for surveillance of implantable subdermal contraceptive: Secondary | ICD-10-CM

## 2014-07-26 DIAGNOSIS — Z3009 Encounter for other general counseling and advice on contraception: Secondary | ICD-10-CM

## 2014-07-26 LAB — POCT PREGNANCY, URINE: Preg Test, Ur: NEGATIVE

## 2014-07-26 MED ORDER — MEDROXYPROGESTERONE ACETATE 104 MG/0.65ML ~~LOC~~ SUSP
104.0000 mg | SUBCUTANEOUS | Status: DC
  Administered 2014-07-26: 104 mg via SUBCUTANEOUS

## 2014-07-26 MED ORDER — MEDROXYPROGESTERONE ACETATE 104 MG/0.65ML ~~LOC~~ SUSP: 104.0000 mg | Freq: Once | SUBCUTANEOUS | Status: DC

## 2014-07-26 NOTE — Progress Notes (Signed)
Patient ID: [name], female   DOB: [birth year], 28 y.o.   MRN: [id number] 28 yo G3P3 here for Nexplanon removal. Patient has had Nexplanon in place for 4 years  Removal Patient given informed consent for removal of her Implanon, time out was performed.  Signed copy in the chart.  Appropriate time out taken. Implanon site identified.  Area prepped in usual sterile fashon. One cc of 1% lidocaine was used to anesthetize the area at the distal end of the implant. A small stab incision was made right beside the implant on the distal portion.  The implanon rod was grasped using hemostats and removed without difficulty.  There was less than 3 cc blood loss. There were no complications.  A small amount of antibiotic ointment and steri-strips were applied over the small incision.  A pressure bandage was applied to reduce any bruising.  The patient tolerated the procedure well and was given post procedure instructions. Patient plans on using depo-provera for contraception

## 2014-07-26 NOTE — Progress Notes (Signed)
Depo Provera  given - pt tolerated well.  Next dose due 12/16-12/30.

## 2014-07-26 NOTE — Addendum Note (Signed)
Addended by: Jill Side on: 07/26/2014 02:45 PM   Modules accepted: Orders

## 2014-08-28 ENCOUNTER — Emergency Department: Payer: Self-pay | Admitting: Emergency Medicine

## 2014-09-04 ENCOUNTER — Encounter: Payer: Self-pay | Admitting: Obstetrics and Gynecology

## 2014-09-25 ENCOUNTER — Emergency Department: Payer: Self-pay | Admitting: Emergency Medicine

## 2014-10-18 ENCOUNTER — Ambulatory Visit: Payer: Medicaid Other

## 2014-10-19 ENCOUNTER — Encounter: Payer: Self-pay | Admitting: *Deleted

## 2014-10-22 ENCOUNTER — Emergency Department: Payer: Self-pay | Admitting: Emergency Medicine

## 2015-02-27 ENCOUNTER — Inpatient Hospital Stay (HOSPITAL_COMMUNITY)
Admission: AD | Admit: 2015-02-27 | Discharge: 2015-02-27 | Disposition: A | Discharge status: Home / Self Care | Payer: Medicaid Other | Source: Ambulatory Visit | Attending: Family Medicine | Admitting: Family Medicine

## 2015-02-27 DIAGNOSIS — F17210 Nicotine dependence, cigarettes, uncomplicated: Secondary | ICD-10-CM | POA: Diagnosis not present

## 2015-02-27 DIAGNOSIS — N939 Abnormal uterine and vaginal bleeding, unspecified: Secondary | ICD-10-CM | POA: Insufficient documentation

## 2015-02-27 LAB — URINALYSIS, ROUTINE W REFLEX MICROSCOPIC
BILIRUBIN URINE: NEGATIVE
GLUCOSE, UA: NEGATIVE mg/dL
KETONES UR: NEGATIVE mg/dL
LEUKOCYTES UA: NEGATIVE
NITRITE: NEGATIVE
PH: 6.5 (ref 5.0–8.0)
Protein, ur: NEGATIVE mg/dL
UROBILINOGEN UA: 0.2 mg/dL (ref 0.0–1.0)

## 2015-02-27 LAB — POCT PREGNANCY, URINE: PREG TEST UR: NEGATIVE

## 2015-02-27 LAB — CBC WITH DIFFERENTIAL/PLATELET
BASOS PCT: 1 % (ref 0–1)
Basophils Absolute: 0.1 10*3/uL (ref 0.0–0.1)
EOS PCT: 3 % (ref 0–5)
Eosinophils Absolute: 0.2 10*3/uL (ref 0.0–0.7)
HCT: 39.3 % (ref 36.0–46.0)
HEMOGLOBIN: 13.5 g/dL (ref 12.0–15.0)
Lymphocytes Relative: 29 % (ref 12–46)
Lymphs Abs: 2.6 10*3/uL (ref 0.7–4.0)
MCH: 29.9 pg (ref 26.0–34.0)
MCHC: 34.4 g/dL (ref 30.0–36.0)
MCV: 87.1 fL (ref 78.0–100.0)
Monocytes Absolute: 0.6 10*3/uL (ref 0.1–1.0)
Monocytes Relative: 7 % (ref 3–12)
Neutro Abs: 5.4 10*3/uL (ref 1.7–7.7)
Neutrophils Relative %: 60 % (ref 43–77)
PLATELETS: 211 10*3/uL (ref 150–400)
RBC: 4.51 MIL/uL (ref 3.87–5.11)
RDW: 12.8 % (ref 11.5–15.5)
WBC: 8.9 10*3/uL (ref 4.0–10.5)

## 2015-02-27 LAB — URINE MICROSCOPIC-ADD ON

## 2015-02-27 MED ORDER — MEDROXYPROGESTERONE ACETATE 10 MG PO TABS: 10.0000 mg | ORAL_TABLET | Freq: Every day | ORAL | Status: DC

## 2015-02-27 NOTE — MAU Note (Signed)
Pt reports she had a normal period last month and then about 5 days later she started bleeding again and that lasted for about 4 days then stopped again for 5 days and then started again this am.

## 2015-02-27 NOTE — MAU Provider Note (Signed)
History     CSN: [id number]  Arrival date and time: 02/27/15 4098   First Provider Initiated Contact with Patient 02/27/15 2056      No chief complaint on file.  HPI  Ms. [name] is a 29 y.o. [phone] who presents to MAU today with complaint of vaginal bleeding. She states that she has had 4 episodes of bleeding this month. The first lasted for 7-8 days, the second for 3-4 days and the third for 4-5 days. She then started bleeding again today. She denies any history of irregular periods since removal of her implanon a few years ago. She was on Depo provera, but has not had an injection since 07/2014 and had resumed normal periods until this month. She states that she has used 2 pads today. She denies weakness, dizziness or fatigue, fever or UTI symptoms. She states mild intermittent lower abdominal sharp pains that are worse with certain movements, coughing or sneezing. She denies abnormal discharge.   OB History    Gravida Para Term Preterm AB TAB SAB Ectopic Multiple Living   Past Medical History  Diagnosis Date  . Vaginal Pap smear, abnormal   . HPV (human papilloma virus) infection   . Chlamydia 2009    No past surgical history on file.  No family history on file.  History  Substance Use Topics  . Smoking status: Current Every Day Smoker -- 1.00 packs/day  . Smokeless tobacco: Not on file  . Alcohol Use: Yes     Comment: occasional    Allergies:  Allergies  Allergen Reactions  . Cocoa Butter Hives and Other (See Comments)    Causes headaches  . Chocolate Rash and Other (See Comments)    Causes headaches  . Codeine Hives and Rash    No prescriptions prior to admission    Review of Systems  Constitutional: Negative for fever and malaise/fatigue.  Gastrointestinal: Positive for abdominal pain. Negative for nausea, vomiting, diarrhea and constipation.  Genitourinary: Negative for dysuria, urgency and frequency.       + vaginal bleeding,  discharge   Physical Exam   Blood pressure 123/82, pulse 82, temperature 97.9 F (36.6 C), temperature source Oral, resp. rate 18, height 5' (1.524 m), weight 155 lb (70.308 kg), last menstrual period 02/27/2015, SpO2 100 %.  Physical Exam  Nursing note and vitals reviewed. Constitutional: She is oriented to person, place, and time. She appears well-developed and well-nourished. No distress.  HENT:  Head: Normocephalic and atraumatic.  Cardiovascular: Normal rate.   Respiratory: Effort normal.  GI: Soft. There is no tenderness.  Genitourinary:  Pelvic deferred until [facility] visit at patient's request  Neurological: She is alert and oriented to person, place, and time.  Skin: Skin is warm and dry. No erythema.  Psychiatric: She has a normal mood and affect.   Results for orders placed or performed during the hospital encounter of 02/27/15 (from the past 24 hour(s))  CBC with Differential/Platelet     Status: None   Collection Time: 02/27/15  8:11 PM  Result Value Ref Range   WBC 8.9 4.0 - 10.5 K/uL   RBC 4.51 3.87 - 5.11 MIL/uL   Hemoglobin 13.5 12.0 - 15.0 g/dL   HCT 29.5 62.1 - 30.8 %   MCV 87.1 78.0 - 100.0 fL   MCH 29.9 26.0 - 34.0 pg   MCHC 34.4 30.0 - 36.0 g/dL   RDW  12.8 11.5 - 15.5 %   Platelets 211 150 - 400 K/uL   Neutrophils Relative % 60 43 - 77 %   Neutro Abs 5.4 1.7 - 7.7 K/uL   Lymphocytes Relative 29 12 - 46 %   Lymphs Abs 2.6 0.7 - 4.0 K/uL   Monocytes Relative 7 3 - 12 %   Monocytes Absolute 0.6 0.1 - 1.0 K/uL   Eosinophils Relative 3 0 - 5 %   Eosinophils Absolute 0.2 0.0 - 0.7 K/uL   Basophils Relative 1 0 - 1 %   Basophils Absolute 0.1 0.0 - 0.1 K/uL  Urinalysis, Routine w reflex microscopic     Status: Abnormal   Collection Time: 02/27/15  8:29 PM  Result Value Ref Range   Color, Urine YELLOW YELLOW   APPearance CLEAR CLEAR   Specific Gravity, Urine <1.005 (L) 1.005 - 1.030   pH 6.5 5.0 - 8.0   Glucose, UA NEGATIVE NEGATIVE mg/dL   Hgb urine  dipstick SMALL (A) NEGATIVE   Bilirubin Urine NEGATIVE NEGATIVE   Ketones, ur NEGATIVE NEGATIVE mg/dL   Protein, ur NEGATIVE NEGATIVE mg/dL   Urobilinogen, UA 0.2 0.0 - 1.0 mg/dL   Nitrite NEGATIVE NEGATIVE   Leukocytes, UA NEGATIVE NEGATIVE  Urine microscopic-add on     Status: Abnormal   Collection Time: 02/27/15  8:29 PM  Result Value Ref Range   Squamous Epithelial / LPF RARE RARE   WBC, UA 0-2 <3 WBC/hpf   RBC / HPF 7-10 <3 RBC/hpf   Bacteria, UA FEW (A) RARE  Pregnancy, urine POC     Status: None   Collection Time: 02/27/15  8:38 PM  Result Value Ref Range   Preg Test, Ur NEGATIVE NEGATIVE     MAU Course  Procedures None  MDM UPT - negative UA,  CBC today Patient is hemodynamically stable and has appointment for further work-up with [facility] on 03/14/15  Assessment and Plan  A: Abnormal uterine bleeding  P: Discharge home Rx for Provera given to patient Bleeding precautions discussed Patient advised to follow-up with [facility]OC as scheduled Patient may return to MAU as needed or if her condition were to change or worsen  Marny LowensteinJulie N Bernedette Auston, PA-C  02/27/2015, 11:35 PM

## 2015-02-27 NOTE — MAU Note (Signed)
Urine pending , provider will come in for triage.

## 2015-02-27 NOTE — Discharge Instructions (Signed)
Abnormal Uterine Bleeding Abnormal uterine bleeding means bleeding from the vagina that is not your normal menstrual period. This can be:  Bleeding or spotting between periods.  Bleeding after sex (sexual intercourse).  Bleeding that is heavier or more than normal.  Periods that last longer than usual.  Bleeding after menopause. There are many problems that may cause this. Treatment will depend on the cause of the bleeding. Any kind of bleeding that is not normal should be reviewed by your doctor.  HOME CARE Watch your condition for any changes. These actions may lessen any discomfort you are having:  Do not use tampons or douches as told by your doctor.  Change your pads often. You should get regular pelvic exams and Pap tests. Keep all appointments for tests as told by your doctor. GET HELP IF:  You are bleeding for more than 1 week.  You feel dizzy at times. GET HELP RIGHT AWAY IF:   You pass out.  You have to change pads every 15 to 30 minutes.  You have belly pain.  You have a fever.  You become sweaty or weak.  You are passing large blood clots from the vagina.  You feel sick to your stomach (nauseous) and throw up (vomit). MAKE SURE YOU:  Understand these instructions.  Will watch your condition.  Will get help right away if you are not doing well or get worse. Document Released: 08/17/2009 Document Revised: 10/25/2013 Document Reviewed: 05/19/2013 [facility] Patient Information 2015 [place]. This information is not intended to replace advice given to you by your health care provider. Make sure you discuss any questions you have with your health care provider. Contraception Choices Contraception (birth control) is the use of any methods or devices to prevent pregnancy. Below are some methods to help avoid pregnancy. HORMONAL METHODS   Contraceptive implant. This is a thin, plastic tube containing progesterone hormone. It does not contain estrogen  hormone. Your health care provider inserts the tube in the inner part of the upper arm. The tube can remain in place for up to 3 years. After 3 years, the implant must be removed. The implant prevents the ovaries from releasing an egg (ovulation), thickens the cervical mucus to prevent sperm from entering the uterus, and thins the lining of the inside of the uterus.  Progesterone-only injections. These injections are given every 3 months by your health care provider to prevent pregnancy. This synthetic progesterone hormone stops the ovaries from releasing eggs. It also thickens cervical mucus and changes the uterine lining. This makes it harder for sperm to survive in the uterus.  Birth control pills. These pills contain estrogen and progesterone hormone. They work by preventing the ovaries from releasing eggs (ovulation). They also cause the cervical mucus to thicken, preventing the sperm from entering the uterus. Birth control pills are prescribed by a health care provider.Birth control pills can also be used to treat heavy periods.  Minipill. This type of birth control pill contains only the progesterone hormone. They are taken every day of each month and must be prescribed by your health care provider.  Birth control patch. The patch contains hormones similar to those in birth control pills. It must be changed once a week and is prescribed by a health care provider.  Vaginal ring. The ring contains hormones similar to those in birth control pills. It is left in the vagina for 3 weeks, removed for 1 week, and then a new one is put back in place. The  patient must be comfortable inserting and removing the ring from the vagina.A health care provider's prescription is necessary.  Emergency contraception. Emergency contraceptives prevent pregnancy after unprotected sexual intercourse. This pill can be taken right after sex or up to 5 days after unprotected sex. It is most effective the sooner you take  the pills after having sexual intercourse. Most emergency contraceptive pills are available without a prescription. Check with your pharmacist. Do not use emergency contraception as your only form of birth control. BARRIER METHODS   Female condom. This is a thin sheath (latex or rubber) that is worn over the penis during sexual intercourse. It can be used with spermicide to increase effectiveness.  Female condom. This is a soft, loose-fitting sheath that is put into the vagina before sexual intercourse.  Diaphragm. This is a soft, latex, dome-shaped barrier that must be fitted by a health care provider. It is inserted into the vagina, along with a spermicidal jelly. It is inserted before intercourse. The diaphragm should be left in the vagina for 6 to 8 hours after intercourse.  Cervical cap. This is a round, soft, latex or plastic cup that fits over the cervix and must be fitted by a health care provider. The cap can be left in place for up to 48 hours after intercourse.  Sponge. This is a soft, circular piece of polyurethane foam. The sponge has spermicide in it. It is inserted into the vagina after wetting it and before sexual intercourse.  Spermicides. These are chemicals that kill or block sperm from entering the cervix and uterus. They come in the form of creams, jellies, suppositories, foam, or tablets. They do not require a prescription. They are inserted into the vagina with an applicator before having sexual intercourse. The process must be repeated every time you have sexual intercourse. INTRAUTERINE CONTRACEPTION  Intrauterine device (IUD). This is a T-shaped device that is put in a woman's uterus during a menstrual period to prevent pregnancy. There are 2 types:  Copper IUD. This type of IUD is wrapped in copper wire and is placed inside the uterus. Copper makes the uterus and fallopian tubes produce a fluid that kills sperm. It can stay in place for 10 years.  Hormone IUD. This type  of IUD contains the hormone progestin (synthetic progesterone). The hormone thickens the cervical mucus and prevents sperm from entering the uterus, and it also thins the uterine lining to prevent implantation of a fertilized egg. The hormone can weaken or kill the sperm that get into the uterus. It can stay in place for 3-5 years, depending on which type of IUD is used. PERMANENT METHODS OF CONTRACEPTION  Female tubal ligation. This is when the woman's fallopian tubes are surgically sealed, tied, or blocked to prevent the egg from traveling to the uterus.  Hysteroscopic sterilization. This involves placing a small coil or insert into each fallopian tube. Your doctor uses a technique called hysteroscopy to do the procedure. The device causes scar tissue to form. This results in permanent blockage of the fallopian tubes, so the sperm cannot fertilize the egg. It takes about 3 months after the procedure for the tubes to become blocked. You must use another form of birth control for these 3 months.  Female sterilization. This is when the female has the tubes that carry sperm tied off (vasectomy).This blocks sperm from entering the vagina during sexual intercourse. After the procedure, the man can still ejaculate fluid (semen). NATURAL PLANNING METHODS  Natural family planning. This is  not having sexual intercourse or using a barrier method (condom, diaphragm, cervical cap) on days the woman could become pregnant.  Calendar method. This is keeping track of the length of each menstrual cycle and identifying when you are fertile.  Ovulation method. This is avoiding sexual intercourse during ovulation.  Symptothermal method. This is avoiding sexual intercourse during ovulation, using a thermometer and ovulation symptoms.  Post-ovulation method. This is timing sexual intercourse after you have ovulated. Regardless of which type or method of contraception you choose, it is important that you use condoms to  protect against the transmission of sexually transmitted infections (STIs). Talk with your health care provider about which form of contraception is most appropriate for you. Document Released: 10/20/2005 Document Revised: 10/25/2013 Document Reviewed: 04/14/2013 [facility] Patient Information 2015 [place], [place]. This information is not intended to replace advice given to you by your health care provider. Make sure you discuss any questions you have with your health care provider.

## 2015-03-14 ENCOUNTER — Telehealth: Payer: Self-pay | Admitting: *Deleted

## 2015-03-14 ENCOUNTER — Encounter: Payer: Self-pay | Admitting: *Deleted

## 2015-03-14 ENCOUNTER — Ambulatory Visit: Payer: Medicaid Other | Admitting: Obstetrics and Gynecology

## 2015-03-14 NOTE — Telephone Encounter (Signed)
[name] missed a scheduled appointment for abnormal uterine bleeding as scheduled from mau visit.  Called [name] and left a message stating she missed a scheduled appointment - important to be rescheduled- please call our office to reschedule .   Will also send letter.

## 2015-06-04 ENCOUNTER — Encounter: Payer: Self-pay | Admitting: Emergency Medicine

## 2015-06-04 ENCOUNTER — Emergency Department
Admission: EM | Admit: 2015-06-04 | Discharge: 2015-06-04 | Disposition: A | Discharge status: Home / Self Care | Payer: Medicaid Other | Attending: Student | Admitting: Student

## 2015-06-04 ENCOUNTER — Emergency Department: Payer: Medicaid Other

## 2015-06-04 DIAGNOSIS — Y9301 Activity, walking, marching and hiking: Secondary | ICD-10-CM | POA: Insufficient documentation

## 2015-06-04 DIAGNOSIS — Y998 Other external cause status: Secondary | ICD-10-CM | POA: Insufficient documentation

## 2015-06-04 DIAGNOSIS — Z720 Tobacco use: Secondary | ICD-10-CM | POA: Insufficient documentation

## 2015-06-04 DIAGNOSIS — Y9289 Other specified places as the place of occurrence of the external cause: Secondary | ICD-10-CM | POA: Diagnosis not present

## 2015-06-04 DIAGNOSIS — X58XXXA Exposure to other specified factors, initial encounter: Secondary | ICD-10-CM | POA: Insufficient documentation

## 2015-06-04 DIAGNOSIS — S8391XA Sprain of unspecified site of right knee, initial encounter: Secondary | ICD-10-CM | POA: Diagnosis not present

## 2015-06-04 DIAGNOSIS — S8991XA Unspecified injury of right lower leg, initial encounter: Secondary | ICD-10-CM | POA: Diagnosis present

## 2015-06-04 DIAGNOSIS — Z793 Long term (current) use of hormonal contraceptives: Secondary | ICD-10-CM | POA: Insufficient documentation

## 2015-06-04 MED ORDER — NAPROXEN 500 MG PO TABS: 500.0000 mg | ORAL_TABLET | Freq: Two times a day (BID) | ORAL | Status: DC

## 2015-06-04 MED ORDER — TRAMADOL HCL 50 MG PO TABS: 50.0000 mg | ORAL_TABLET | Freq: Four times a day (QID) | ORAL | Status: DC | PRN

## 2015-06-04 NOTE — ED Provider Notes (Signed)
[facility] Emergency Department Provider Note  ____________________________________________  Time seen: Approximately 1:45 PM  I have reviewed the triage vital signs and the nursing notes.   HISTORY  Chief Complaint Knee Pain    HPI [name] is a 29 y.o. female complaining of acute onset of right knee pain secondary to a twisting incident which occurred 2 days ago. Patient states since the incident she's having trouble ambulating and increasing pain to the popliteal area of the right knee. Patient rates the pain as a 4/10 which increased to a 6/10 with palpation of the upper to area of the knee. Patient states she is taking ibuprofen without any significant relief.   Past Medical History  Diagnosis Date  . Vaginal Pap smear, abnormal   . HPV (human papilloma virus) infection   . Chlamydia 2009    There are no active problems to display for this patient.   History reviewed. No pertinent past surgical history.  Current Outpatient Rx  Name  Route  Sig  Dispense  Refill  . medroxyPROGESTERone (PROVERA) 10 MG tablet   Oral   Take 1 tablet (10 mg total) by mouth daily.   10 tablet   0     Allergies Cocoa butter; Chocolate; and Codeine  No family history on file.  Social History History  Substance Use Topics  . Smoking status: Current Every Day Smoker -- 1.00 packs/day  . Smokeless tobacco: Not on file  . Alcohol Use: Yes     Comment: occasional    Review of Systems Constitutional: No fever/chills Eyes: No visual changes. ENT: No sore throat. Cardiovascular: Denies chest pain. Respiratory: Denies shortness of breath. Gastrointestinal: No abdominal pain.  No nausea, no vomiting.  No diarrhea.  No constipation. Genitourinary: Negative for dysuria. Musculoskeletal: Right knee pain Skin: Negative for rash. Neurological: Negative for headaches, focal weakness or numbness. 10-point ROS otherwise  negative.  ____________________________________________   PHYSICAL EXAM:  VITAL SIGNS: ED Triage Vitals  Enc Vitals Group     BP 06/04/15 1321 97/64 mmHg     Pulse Rate 06/04/15 1321 82     Resp --      Temp 06/04/15 1321 98.7 F (37.1 C)     Temp Source 06/04/15 1321 Oral     SpO2 06/04/15 1321 99 %     Weight 06/04/15 1314 145 lb (65.772 kg)     Height 06/04/15 1314 5' (1.524 m)     Head Cir --      Peak Flow --      Pain Score 06/04/15 1315 4     Pain Loc --      Pain Edu? --      Excl. in GC? --     Constitutional: Alert and oriented. Well appearing and in no acute distress. Eyes: Conjunctivae are normal. PERRL. EOMI. Head: Atraumatic. Nose: No congestion/rhinnorhea. Mouth/Throat: Mucous membranes are moist.  Oropharynx non-erythematous. Neck: No stridor.  No cervical spine tenderness to palpation. Hematological/Lymphatic/Immunilogical: No cervical lymphadenopathy. Cardiovascular: Normal rate, regular rhythm. Grossly normal heart sounds.  Good peripheral circulation. Respiratory: Normal respiratory effort.  No retractions. Lungs CTAB. Gastrointestinal: Soft and nontender. No distention. No abdominal bruits. No CVA tenderness. Musculoskeletal: No deformity or edema erythema to the right knee. No crepitus upon palpation. Moderate guarding palpation of upper to area. Patient has atypical gait favoring the right lower extremity. Neurologic:  Normal speech and language. No gross focal neurologic deficits are appreciated. No gait instability. Skin:  Skin is  warm, dry and intact. No rash noted. Psychiatric: Mood and affect are normal. Speech and behavior are normal.  ____________________________________________   LABS (all labs ordered are listed, but only abnormal results are displayed)  Labs Reviewed - No data to display ____________________________________________  EKG   ____________________________________________  RADIOLOGY  No acute findings. I, Joni Reining, personally viewed and evaluated these images as part of my medical decision making.   ____________________________________________   PROCEDURES  Procedure(s) performed: None  Critical Care performed: No  ____________________________________________   INITIAL IMPRESSION / ASSESSMENT AND PLAN / ED COURSE  Pertinent labs & imaging results that were available during my care of the patient were reviewed by me and considered in my medical decision making (see chart for details).  Right knee sprain. Patient placed in a knee immobilizer given naproxen and tramadol take as directed. Patient advised [facility] if no improvement return to ER if condition worsens. ____________________________________________   FINAL CLINICAL IMPRESSION(S) / ED DIAGNOSES  Final diagnoses:  Right knee sprain, initial encounter      Joni Reining, PA-C 06/04/15 1440  Gayla Doss, MD 06/04/15 [phone]

## 2015-06-04 NOTE — ED Notes (Signed)
States she was walking this weekend and may have twisted her right knee..increased pain with ambulation

## 2015-07-09 ENCOUNTER — Emergency Department
Admission: EM | Admit: 2015-07-09 | Discharge: 2015-07-09 | Disposition: A | Discharge status: Home / Self Care | Payer: Medicaid Other | Attending: Emergency Medicine | Admitting: Emergency Medicine

## 2015-07-09 ENCOUNTER — Encounter: Payer: Self-pay | Admitting: Emergency Medicine

## 2015-07-09 DIAGNOSIS — J0100 Acute maxillary sinusitis, unspecified: Secondary | ICD-10-CM

## 2015-07-09 DIAGNOSIS — Z79899 Other long term (current) drug therapy: Secondary | ICD-10-CM | POA: Diagnosis not present

## 2015-07-09 DIAGNOSIS — Z720 Tobacco use: Secondary | ICD-10-CM | POA: Diagnosis not present

## 2015-07-09 DIAGNOSIS — Z791 Long term (current) use of non-steroidal anti-inflammatories (NSAID): Secondary | ICD-10-CM | POA: Insufficient documentation

## 2015-07-09 DIAGNOSIS — R0981 Nasal congestion: Secondary | ICD-10-CM | POA: Diagnosis present

## 2015-07-09 MED ORDER — AMOXICILLIN 500 MG PO CAPS: 500.0000 mg | ORAL_CAPSULE | Freq: Three times a day (TID) | ORAL | Status: DC

## 2015-07-09 MED ORDER — FLUTICASONE PROPIONATE 50 MCG/ACT NA SUSP: 1.0000 | Freq: Every day | NASAL | Status: DC

## 2015-07-09 MED ORDER — BENZONATATE 100 MG PO CAPS: 100.0000 mg | ORAL_CAPSULE | Freq: Three times a day (TID) | ORAL | Status: DC | PRN

## 2015-07-09 NOTE — Discharge Instructions (Signed)
Sinusitis °Sinusitis is redness, soreness, and inflammation of the paranasal sinuses. Paranasal sinuses are air pockets within the bones of your face (beneath the eyes, the middle of the forehead, or above the eyes). In healthy paranasal sinuses, mucus is able to drain out, and air is able to circulate through them by way of your nose. However, when your paranasal sinuses are inflamed, mucus and air can become trapped. This can allow bacteria and other germs to grow and cause infection. °Sinusitis can develop quickly and last only a short time (acute) or continue over a long period (chronic). Sinusitis that lasts for more than 12 weeks is considered chronic.  °CAUSES  °Causes of sinusitis include: °· Allergies. °· Structural abnormalities, such as displacement of the cartilage that separates your nostrils (deviated septum), which can decrease the air flow through your nose and sinuses and affect sinus drainage. °· Functional abnormalities, such as when the small hairs (cilia) that line your sinuses and help remove mucus do not work properly or are not present. °SIGNS AND SYMPTOMS  °Symptoms of acute and chronic sinusitis are the same. The primary symptoms are pain and pressure around the affected sinuses. Other symptoms include: °· Upper toothache. °· Earache. °· Headache. °· Bad breath. °· Decreased sense of smell and taste. °· A cough, which worsens when you are lying flat. °· Fatigue. °· Fever. °· Thick drainage from your nose, which often is green and may contain pus (purulent). °· Swelling and warmth over the affected sinuses. °DIAGNOSIS  °Your health care provider will perform a physical exam. During the exam, your health care provider may: °· Look in your nose for signs of abnormal growths in your nostrils (nasal polyps). °· Tap over the affected sinus to check for signs of infection. °· View the inside of your sinuses (endoscopy) using an imaging device that has a light attached (endoscope). °If your health  care provider suspects that you have chronic sinusitis, one or more of the following tests may be recommended: °· Allergy tests. °· Nasal culture. A sample of mucus is taken from your nose, sent to a lab, and screened for bacteria. °· Nasal cytology. A sample of mucus is taken from your nose and examined by your health care provider to determine if your sinusitis is related to an allergy. °TREATMENT  °Most cases of acute sinusitis are related to a viral infection and will resolve on their own within 10 days. Sometimes medicines are prescribed to help relieve symptoms (pain medicine, decongestants, nasal steroid sprays, or saline sprays).  °However, for sinusitis related to a bacterial infection, your health care provider will prescribe antibiotic medicines. These are medicines that will help kill the bacteria causing the infection.  °Rarely, sinusitis is caused by a fungal infection. In theses cases, your health care provider will prescribe antifungal medicine. °For some cases of chronic sinusitis, surgery is needed. Generally, these are cases in which sinusitis recurs more than 3 times per year, despite other treatments. °HOME CARE INSTRUCTIONS  °· Drink plenty of water. Water helps thin the mucus so your sinuses can drain more easily. °· Use a humidifier. °· Inhale steam 3 to 4 times a day (for example, sit in the bathroom with the shower running). °· Apply a warm, moist washcloth to your face 3 to 4 times a day, or as directed by your health care provider. °· Use saline nasal sprays to help moisten and clean your sinuses. °· Take medicines only as directed by your health care provider. °·   If you were prescribed either an antibiotic or antifungal medicine, finish it all even if you start to feel better. SEEK IMMEDIATE MEDICAL CARE IF:  You have increasing pain or severe headaches.  You have nausea, vomiting, or drowsiness.  You have swelling around your face.  You have vision problems.  You have a stiff  neck.  You have difficulty breathing. MAKE SURE YOU:   Understand these instructions.  Will watch your condition.  Will get help right away if you are not doing well or get worse. Document Released: 10/20/2005 Document Revised: 03/06/2014 Document Reviewed: 11/04/2011 [facility] Patient Information 2015 [place], [place]. This information is not intended to replace advice given to you by your health care provider. Make sure you discuss any questions you have with your health care provider.   Take the prescription meds as directed. Follow-up with Dr. Welton Flakes as needed. Dose Advil+ Pseudoephedrine for sinus pressure relief. Use Mucinex to thin and loosen mucous.

## 2015-07-09 NOTE — ED Provider Notes (Signed)
[facility] Emergency Department Provider Note ____________________________________________  Time seen: 1030  I have reviewed the triage vital signs and the nursing notes.  HISTORY  Chief Complaint  Nasal Congestion  HPI [name] is a 29 y.o. female reports to the ED for evaluation of "a sinus infection." She reports onset of severe cough, congestion, and sinus headache of the last 2 days. She reports fatigue and chills without frank fevers. She does report hearing a little nasal discharge and productive cough over the last few days. She reports that her cough is worse at night and the sinus congestion and drainage is worse in the morning. She has been dosing Advil Cold and Sinus as well as ibuprofen for her symptoms. She also reports an intermittent sore throat.she she reports her symptoms at a 5/10 in triage.  Past Medical History  Diagnosis Date  . Vaginal Pap smear, abnormal   . HPV (human papilloma virus) infection   . Chlamydia 2009   There are no active problems to display for this patient.  History reviewed. No pertinent past surgical history.  Current Outpatient Rx  Name  Route  Sig  Dispense  Refill  . amoxicillin (AMOXIL) 500 MG capsule   Oral   Take 1 capsule (500 mg total) by mouth 3 (three) times daily.   30 capsule   0   . benzonatate (TESSALON PERLES) 100 MG capsule   Oral   Take 1 capsule (100 mg total) by mouth 3 (three) times daily as needed for cough (Take 1-2 per dose).   30 capsule   0   . fluticasone (FLONASE) 50 MCG/ACT nasal spray   Each Nare   Place 1 spray into both nostrils daily.   16 g   0   . medroxyPROGESTERone (PROVERA) 10 MG tablet   Oral   Take 1 tablet (10 mg total) by mouth daily.   10 tablet   0   . naproxen (NAPROSYN) 500 MG tablet   Oral   Take 1 tablet (500 mg total) by mouth 2 (two) times daily with a meal.   20 tablet   0   . traMADol (ULTRAM) 50 MG tablet   Oral   Take 1 tablet (50 mg  total) by mouth every 6 (six) hours as needed for moderate pain.   12 tablet   0    Allergies Cocoa butter; Chocolate; and Codeine  History reviewed. No pertinent family history.  Social History Social History  Substance Use Topics  . Smoking status: Current Every Day Smoker -- 1.00 packs/day  . Smokeless tobacco: None  . Alcohol Use: Yes     Comment: occasional   Review of Systems  Constitutional: Negative for fever. Eyes: Negative for visual changes. ENT: Positive for sore throat, congestion, and cough Cardiovascular: Negative for chest pain. Respiratory: Negative for shortness of breath. Gastrointestinal: Negative for abdominal pain, vomiting and diarrhea. Genitourinary: Negative for dysuria. Musculoskeletal: Negative for back pain. Skin: Negative for rash. Neurological: Negative for headaches, focal weakness or numbness. ____________________________________________  PHYSICAL EXAM:  VITAL SIGNS: ED Triage Vitals  Enc Vitals Group     BP 07/09/15 0942 117/76 mmHg     Pulse Rate 07/09/15 0942 86     Resp 07/09/15 0942 18     Temp 07/09/15 0942 98.7 F (37.1 C)     Temp Source 07/09/15 0942 Oral     SpO2 07/09/15 0942 100 %     Weight 07/09/15 0942 146 lb (66.225  kg)     Height 07/09/15 0942 5' (1.524 m)     Head Cir --      Peak Flow --      Pain Score 07/09/15 0942 5     Pain Loc --      Pain Edu? --      Excl. in GC? --    Constitutional: Alert and oriented. Well appearing and in no distress. Eyes: Conjunctivae are normal. PERRL. Normal extraocular movements. Ears: Canals clear. TMs intact without bulge or effusion.   Head: Normocephalic and atraumatic.   Nose: Mild nasal congestion and erythema. Clear rhinorrhea.   Mouth/Throat: Mucous membranes are moist.   Neck: Supple. No thyromegaly. Hematological/Lymphatic/Immunological: No cervical lymphadenopathy. Cardiovascular: Normal rate, regular rhythm.  Respiratory: Normal respiratory effort.  No wheezes/rales/rhonchi. Gastrointestinal: Soft and nontender. No distention. Musculoskeletal: Nontender with normal range of motion in all extremities.  Neurologic:  Normal gait without ataxia. Normal speech and language. No gross focal neurologic deficits are appreciated. Skin:  Skin is warm, dry and intact. No rash noted. Psychiatric: Mood and affect are normal. Patient exhibits appropriate insight and judgment. ____________________________________________  INITIAL IMPRESSION / ASSESSMENT AND PLAN / ED COURSE  Acute maxillary sinusitis with the rhinitis component. Also upper respiratory symptoms including cough. We'll treat with prescription Flonase, Tessalon Perles, and amoxicillin. Patient is to continue and feel plus pseudoephedrine over-the-counter, and consider starting Mucinex for thinning sinus drainage. She is to follow-up with Dr. Yves Dill for ongoing symptoms. ____________________________________________  FINAL CLINICAL IMPRESSION(S) / ED DIAGNOSES  Final diagnoses:  Acute maxillary sinusitis, recurrence not specified      Lissa Hoard, PA-C 07/09/15 1059  Jennye Moccasin, MD 07/09/15 1302

## 2015-07-09 NOTE — ED Notes (Signed)
Reports cough, congestion and headache x 2 days.  NAD

## 2017-04-23 IMAGING — CR DG KNEE COMPLETE 4+V*R*
1 series · 4 of 4 positions shown · non-contrast
Comparison: None.

CLINICAL DATA: Acute onset of posterior right knee pain

EXAM:
RIGHT KNEE - COMPLETE 4+ VIEW

[Series 1: dg knee complete 4 views right · 0.14mm/px · 4 of 4 slices shown]
[im 1/4]
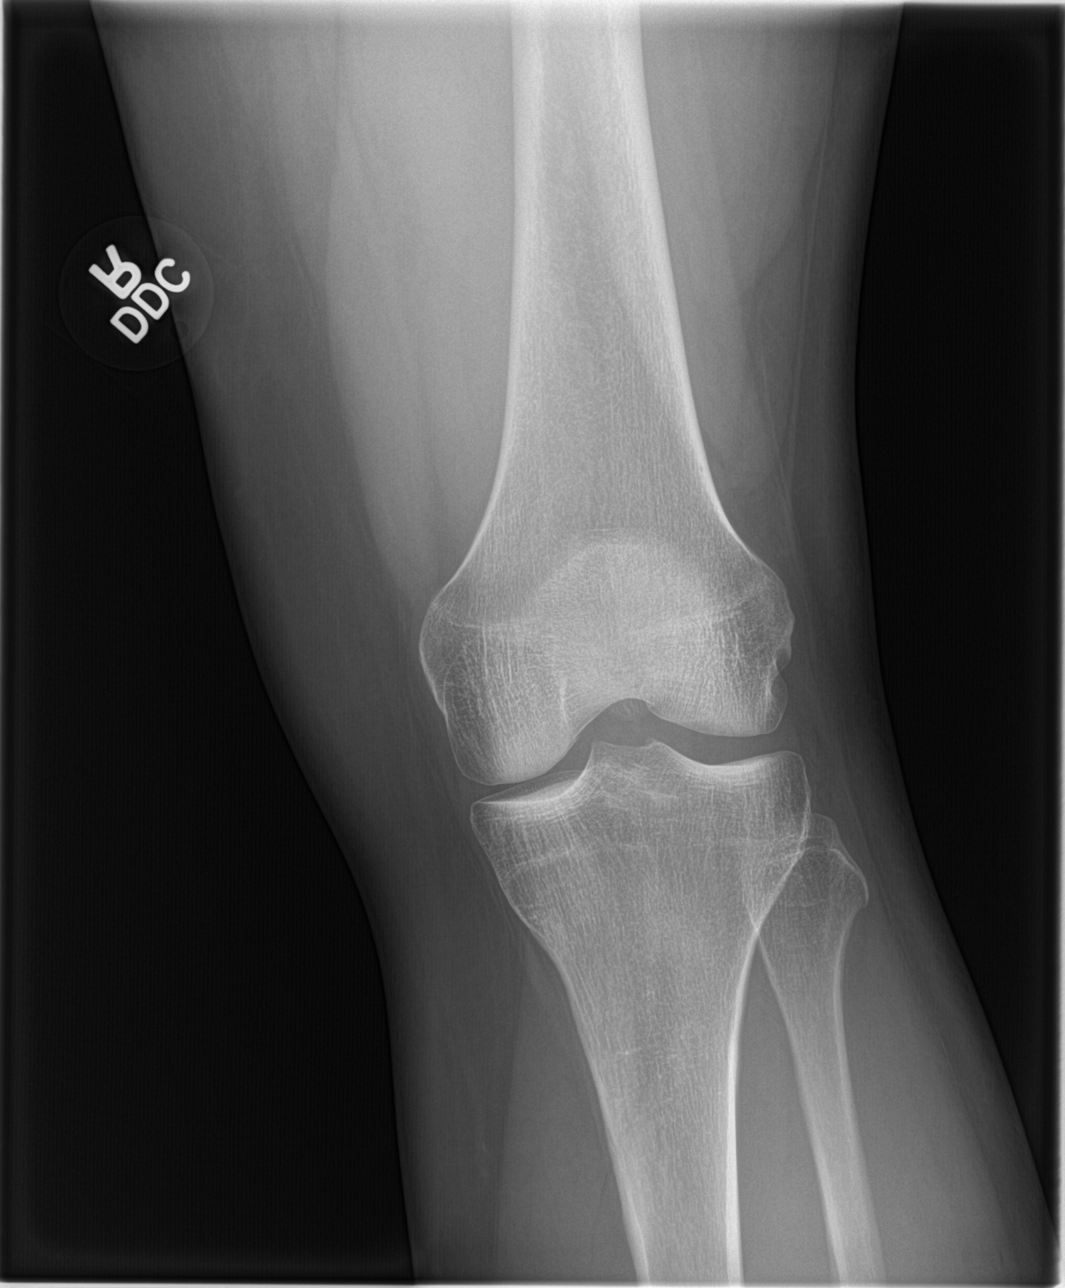
[im 2/4]
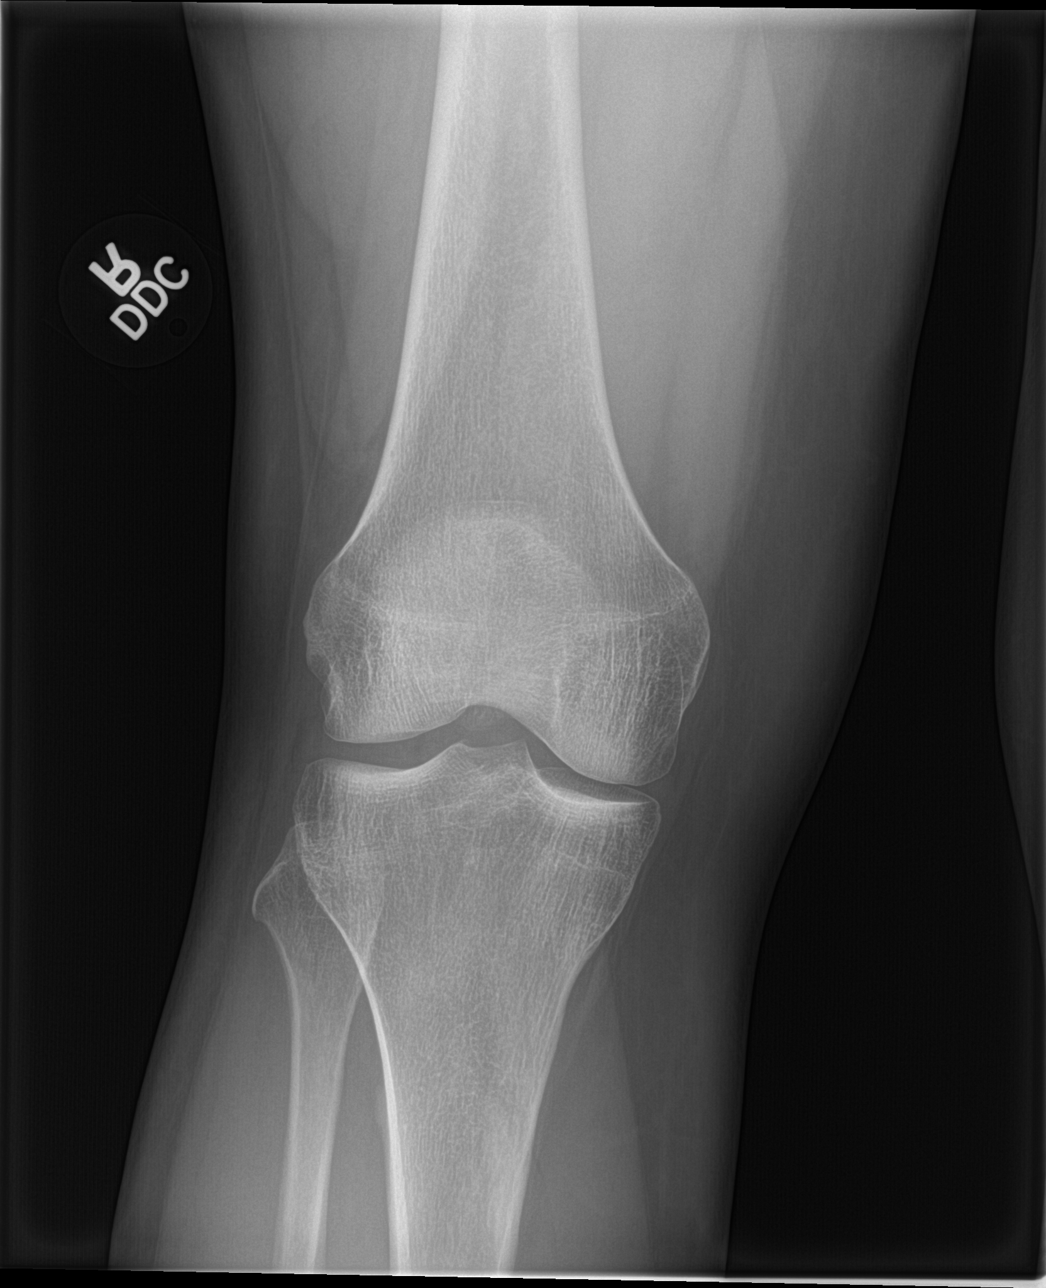
[im 3/4]
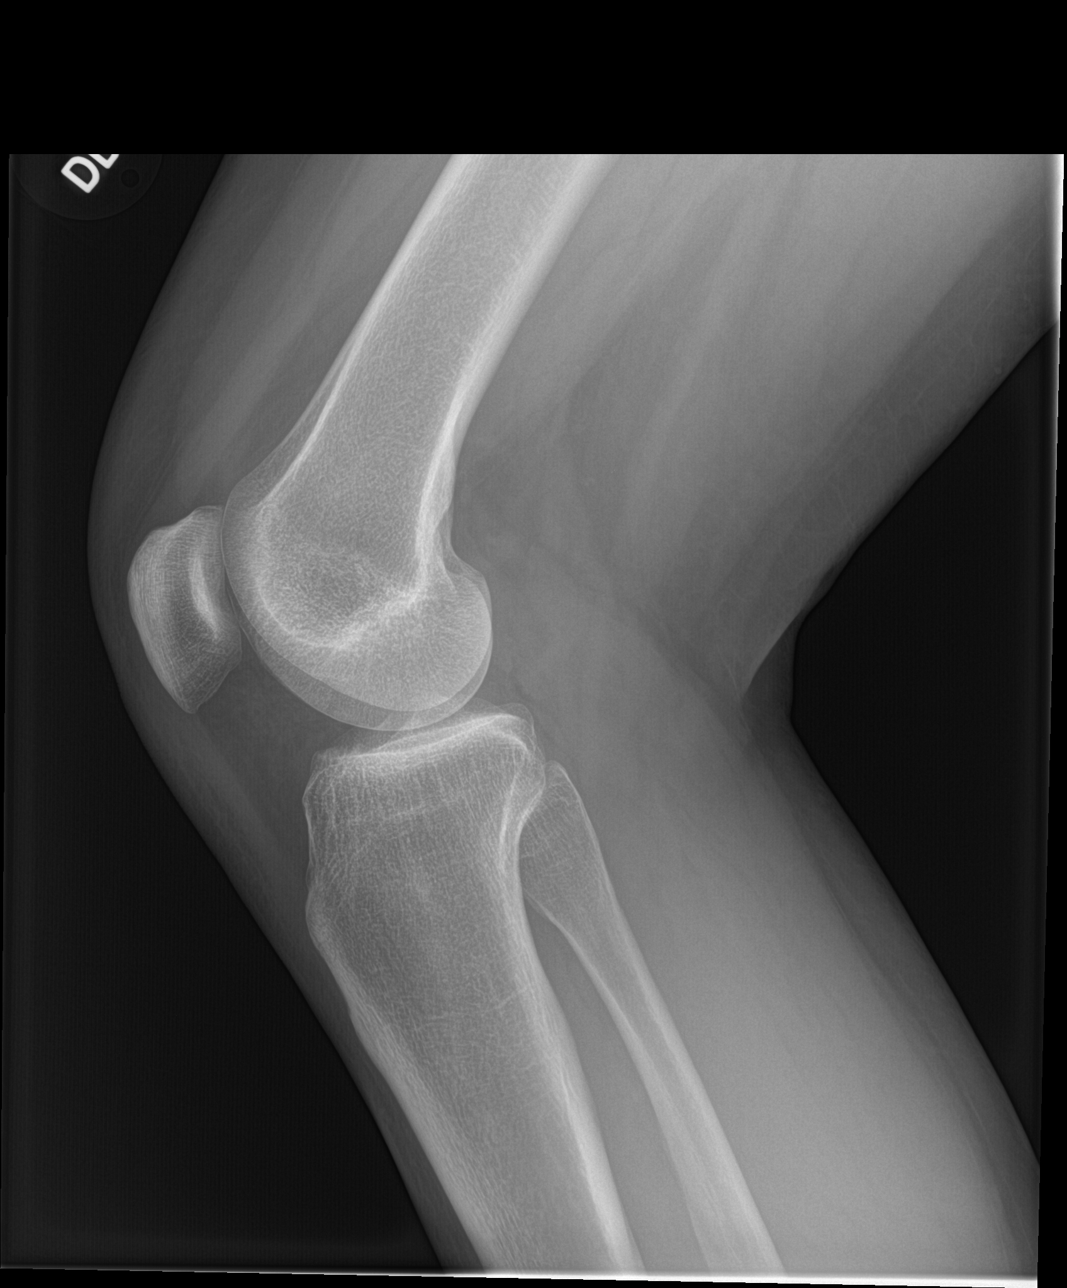
[im 4/4]
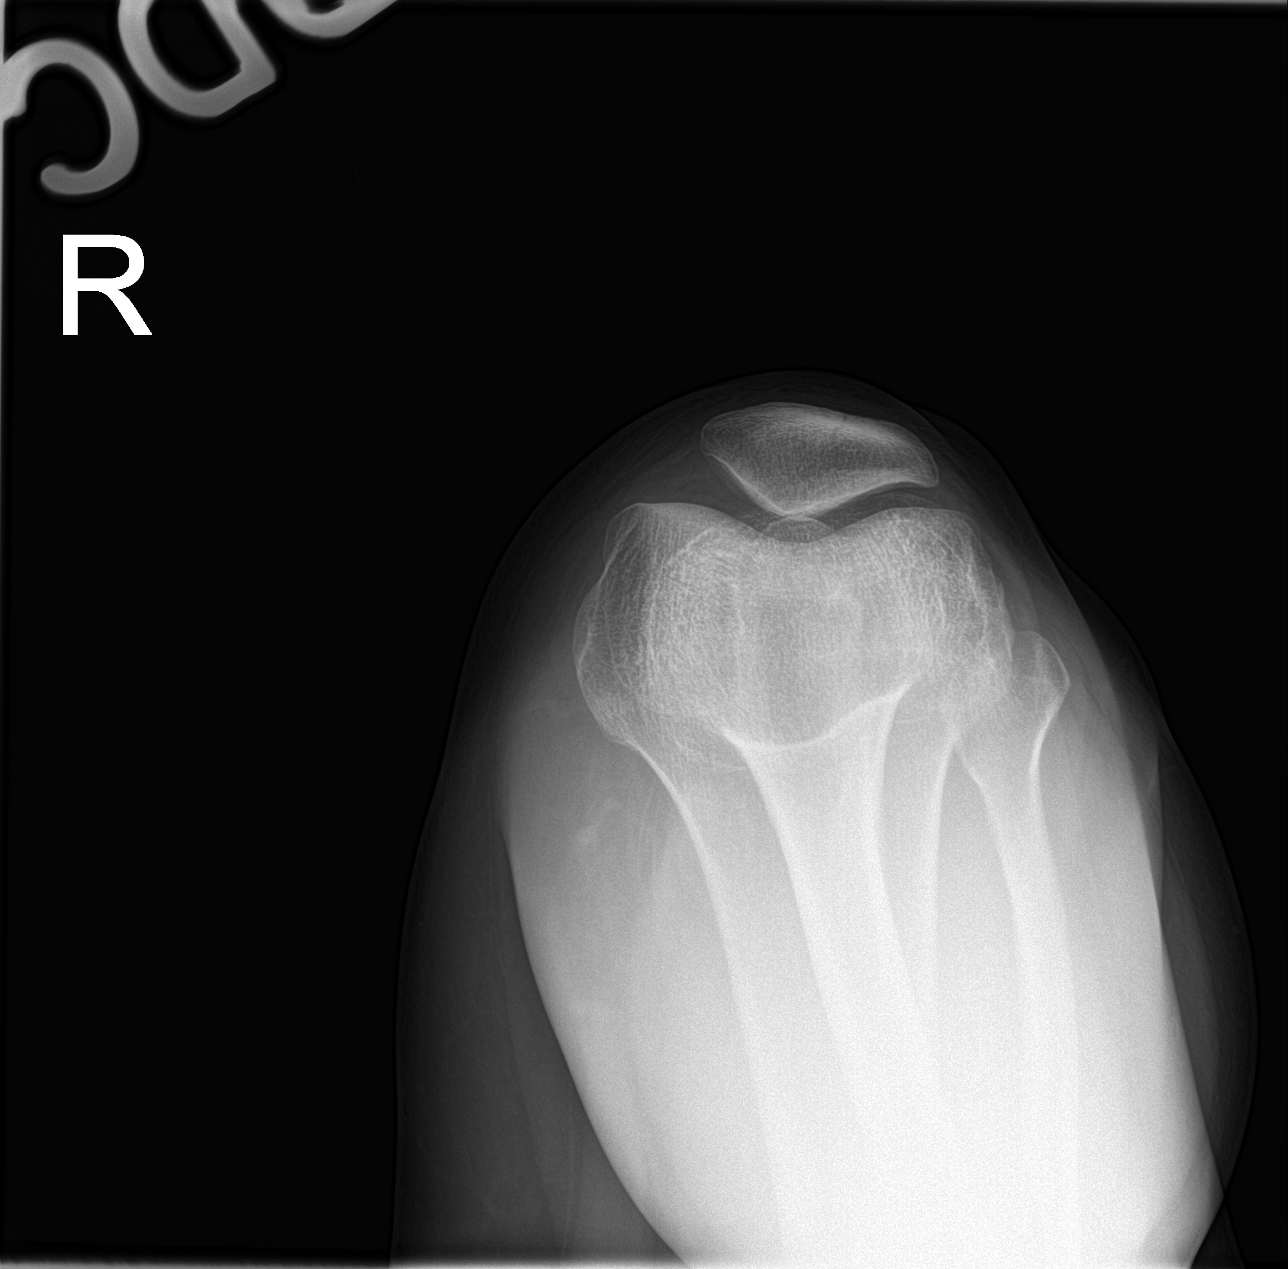

[4 of 4 positions shown; findings below may reference images not displayed]

FINDINGS: Four views of the right knee submitted. No acute fracture or
subluxation. No radiopaque foreign body. No joint effusion.
IMPRESSION: Negative.

## 2020-10-10 ENCOUNTER — Encounter (HOSPITAL_COMMUNITY): Payer: Self-pay | Admitting: Emergency Medicine

## 2020-10-10 ENCOUNTER — Ambulatory Visit (INDEPENDENT_AMBULATORY_CARE_PROVIDER_SITE_OTHER): Payer: Self-pay

## 2020-10-10 ENCOUNTER — Ambulatory Visit (HOSPITAL_COMMUNITY)
Admission: EM | Admit: 2020-10-10 | Discharge: 2020-10-10 | Disposition: A | Discharge status: Home / Self Care | Payer: Self-pay | Attending: Urgent Care | Admitting: Urgent Care

## 2020-10-10 ENCOUNTER — Other Ambulatory Visit: Payer: Self-pay

## 2020-10-10 DIAGNOSIS — S6992XA Unspecified injury of left wrist, hand and finger(s), initial encounter: Secondary | ICD-10-CM

## 2020-10-10 DIAGNOSIS — S60222A Contusion of left hand, initial encounter: Secondary | ICD-10-CM

## 2020-10-10 DIAGNOSIS — M79642 Pain in left hand: Secondary | ICD-10-CM

## 2020-10-10 DIAGNOSIS — W19XXXA Unspecified fall, initial encounter: Secondary | ICD-10-CM

## 2020-10-10 MED ORDER — ACETAMINOPHEN 325 MG PO TABS
650.0000 mg | ORAL_TABLET | Freq: Once | ORAL | Status: AC
  Administered 2020-10-10: 650 mg via ORAL

## 2020-10-10 MED ORDER — ACETAMINOPHEN 325 MG PO TABS
ORAL_TABLET | ORAL | Status: AC
  Filled 2020-10-10: qty 2

## 2020-10-10 MED ORDER — NAPROXEN 500 MG PO TABS: 500.0000 mg | ORAL_TABLET | Freq: Two times a day (BID) | ORAL | 0 refills | Status: DC

## 2020-10-10 NOTE — ED Triage Notes (Signed)
Pt presents with left hand/ pinky finger pain after tripping and falling over dog this am.

## 2020-10-10 NOTE — ED Provider Notes (Signed)
[facility]   MRN: [id number] DOB: [birth year]  Subjective:   [name] is a 34 y.o. female presenting for acute injury today on her left hand. Patient tripped over her dogs and fell directly onto her left ulnar side of the hand making impact against the edge of a table. Has had pain since over left 5th finger into left hand. No medications taken prior to coming here.    Current Facility-Administered Medications:  .  acetaminophen (TYLENOL) tablet 650 mg, 650 mg, Oral, Once, Wallis Bamberg, PA-C  Current Outpatient Medications:  .  amoxicillin (AMOXIL) 500 MG capsule, Take 1 capsule (500 mg total) by mouth 3 (three) times daily., Disp: 30 capsule, Rfl: 0 .  benzonatate (TESSALON PERLES) 100 MG capsule, Take 1 capsule (100 mg total) by mouth 3 (three) times daily as needed for cough (Take 1-2 per dose)., Disp: 30 capsule, Rfl: 0 .  fluticasone (FLONASE) 50 MCG/ACT nasal spray, Place 1 spray into both nostrils daily., Disp: 16 g, Rfl: 0 .  medroxyPROGESTERone (PROVERA) 10 MG tablet, Take 1 tablet (10 mg total) by mouth daily., Disp: 10 tablet, Rfl: 0 .  naproxen (NAPROSYN) 500 MG tablet, Take 1 tablet (500 mg total) by mouth 2 (two) times daily with a meal., Disp: 20 tablet, Rfl: 0 .  traMADol (ULTRAM) 50 MG tablet, Take 1 tablet (50 mg total) by mouth every 6 (six) hours as needed for moderate pain., Disp: 12 tablet, Rfl: 0   Allergies  Allergen Reactions  . Cocoa Butter Hives and Other (See Comments)    Causes headaches  . Chocolate Rash and Other (See Comments)    Causes headaches  . Codeine Hives and Rash    Past Medical History:  Diagnosis Date  . Chlamydia 2009  . HPV (human papilloma virus) infection   . Vaginal Pap smear, abnormal      History reviewed. No pertinent surgical history.  History reviewed. No pertinent family history.  Social History   Tobacco Use  . Smoking status: Current Every Day Smoker    Packs/day: 1.00  . Smokeless tobacco:  Never Used  Substance Use Topics  . Alcohol use: Yes    Comment: occasional  . Drug use: No    ROS   Objective:   Vitals: BP 125/85 (BP Location: Left Arm)   Pulse 87   Temp 98.2 F (36.8 C) (Oral)   Resp 17   LMP  (Within Weeks)   SpO2 98%   Physical Exam Constitutional:      General: She is not in acute distress.    Appearance: Normal appearance. She is well-developed. She is not ill-appearing, toxic-appearing or diaphoretic.  HENT:     Head: Normocephalic and atraumatic.     Nose: Nose normal.     Mouth/Throat:     Mouth: Mucous membranes are moist.     Pharynx: Oropharynx is clear.  Eyes:     General: No scleral icterus.    Extraocular Movements: Extraocular movements intact.     Pupils: Pupils are equal, round, and reactive to light.  Cardiovascular:     Rate and Rhythm: Normal rate.  Pulmonary:     Effort: Pulmonary effort is normal.  Musculoskeletal:       Hands:  Skin:    General: Skin is warm and dry.  Neurological:     General: No focal deficit present.     Mental Status: She is alert and oriented to person, place, and time.  Psychiatric:        Mood and Affect: Mood normal.        Behavior: Behavior normal.     DG Hand Complete Left  Result Date: 10/10/2020 CLINICAL DATA:  Left hand injury, pain.  Fall. EXAM: LEFT HAND - COMPLETE 3+ VIEW COMPARISON:  None. FINDINGS: There is no evidence of fracture or dislocation. There is no evidence of arthropathy or other focal bone abnormality. Soft tissues are unremarkable. IMPRESSION: Negative. Electronically Signed   By: Charlett Nose M.D.   On: 10/10/2020 11:10   Assessment and Plan :   PDMP not reviewed this encounter.  1. Left hand pain   2. Contusion of left hand, initial encounter     Recommended conservative management for hand contusion. Left hand wrapped using 2" ace wrap. APAP in clinic. Naproxen at home. Counseled patient on potential for adverse effects with medications  prescribed/recommended today, ER and return-to-clinic precautions discussed, patient verbalized understanding.    Wallis Bamberg, PA-C 10/10/20 1133

## 2021-01-15 ENCOUNTER — Emergency Department (HOSPITAL_COMMUNITY): Payer: Self-pay

## 2021-01-15 ENCOUNTER — Emergency Department (HOSPITAL_COMMUNITY)
Admission: EM | Admit: 2021-01-15 | Discharge: 2021-01-15 | Disposition: A | Discharge status: Home / Self Care | Payer: Self-pay | Attending: Emergency Medicine | Admitting: Emergency Medicine

## 2021-01-15 ENCOUNTER — Other Ambulatory Visit: Payer: Self-pay

## 2021-01-15 ENCOUNTER — Encounter (HOSPITAL_COMMUNITY): Payer: Self-pay | Admitting: Emergency Medicine

## 2021-01-15 ENCOUNTER — Ambulatory Visit (HOSPITAL_COMMUNITY)
Admission: EM | Admit: 2021-01-15 | Discharge: 2021-01-15 | Disposition: A | Discharge status: Home / Self Care | Payer: Medicaid Other

## 2021-01-15 ENCOUNTER — Encounter (HOSPITAL_COMMUNITY): Payer: Self-pay

## 2021-01-15 DIAGNOSIS — F17210 Nicotine dependence, cigarettes, uncomplicated: Secondary | ICD-10-CM | POA: Insufficient documentation

## 2021-01-15 DIAGNOSIS — F17200 Nicotine dependence, unspecified, uncomplicated: Secondary | ICD-10-CM

## 2021-01-15 DIAGNOSIS — R110 Nausea: Secondary | ICD-10-CM | POA: Insufficient documentation

## 2021-01-15 DIAGNOSIS — R1013 Epigastric pain: Secondary | ICD-10-CM

## 2021-01-15 DIAGNOSIS — R197 Diarrhea, unspecified: Secondary | ICD-10-CM | POA: Insufficient documentation

## 2021-01-15 DIAGNOSIS — K2900 Acute gastritis without bleeding: Secondary | ICD-10-CM

## 2021-01-15 LAB — CBC
HCT: 40.5 % (ref 36.0–46.0)
Hemoglobin: 13.8 g/dL (ref 12.0–15.0)
MCH: 29.9 pg (ref 26.0–34.0)
MCHC: 34.1 g/dL (ref 30.0–36.0)
MCV: 87.9 fL (ref 80.0–100.0)
Platelets: 268 10*3/uL (ref 150–400)
RBC: 4.61 MIL/uL (ref 3.87–5.11)
RDW: 11.9 % (ref 11.5–15.5)
WBC: 7 10*3/uL (ref 4.0–10.5)
nRBC: 0 % (ref 0.0–0.2)

## 2021-01-15 LAB — URINALYSIS, MICROSCOPIC (REFLEX)

## 2021-01-15 LAB — COMPREHENSIVE METABOLIC PANEL
ALT: 35 U/L (ref 0–44)
AST: 28 U/L (ref 15–41)
Albumin: 3.9 g/dL (ref 3.5–5.0)
Alkaline Phosphatase: 81 U/L (ref 38–126)
Anion gap: 9 (ref 5–15)
BUN: 9 mg/dL (ref 6–20)
CO2: 23 mmol/L (ref 22–32)
Calcium: 9.7 mg/dL (ref 8.9–10.3)
Chloride: 106 mmol/L (ref 98–111)
Creatinine, Ser: 0.67 mg/dL (ref 0.44–1.00)
GFR, Estimated: >60 mL/min (ref >60)
Glucose, Bld: 100 mg/dL — ABNORMAL HIGH (ref 70–99)
Potassium: 3.8 mmol/L (ref 3.5–5.1)
Sodium: 138 mmol/L (ref 135–145)
Total Bilirubin: 0.9 mg/dL (ref 0.3–1.2)
Total Protein: 6.9 g/dL (ref 6.5–8.1)

## 2021-01-15 LAB — URINALYSIS, ROUTINE W REFLEX MICROSCOPIC
Bilirubin Urine: NEGATIVE
Glucose, UA: NEGATIVE mg/dL
Ketones, ur: NEGATIVE mg/dL
Nitrite: NEGATIVE
Protein, ur: 30 mg/dL — AB
Specific Gravity, Urine: <1.005 — ABNORMAL LOW (ref 1.005–1.030)
pH: 7 (ref 5.0–8.0)

## 2021-01-15 LAB — LIPASE, BLOOD: Lipase: 33 U/L (ref 11–51)

## 2021-01-15 LAB — I-STAT BETA HCG BLOOD, ED (MC, WL, AP ONLY): I-stat hCG, quantitative: <5 m[IU]/mL (ref <5)

## 2021-01-15 MED ORDER — ONDANSETRON 4 MG PO TBDP
4.0000 mg | ORAL_TABLET | Freq: Once | ORAL | Status: AC
  Administered 2021-01-15: 4 mg via ORAL
  Filled 2021-01-15: qty 1

## 2021-01-15 MED ORDER — OMEPRAZOLE 20 MG PO CPDR: 20.0000 mg | DELAYED_RELEASE_CAPSULE | Freq: Every day | ORAL | 1 refills | Status: DC

## 2021-01-15 MED ORDER — IOHEXOL 300 MG/ML  SOLN
100.0000 mL | Freq: Once | INTRAMUSCULAR | Status: AC | PRN
  Administered 2021-01-15: 100 mL via INTRAVENOUS

## 2021-01-15 MED ORDER — SUCRALFATE 1 G PO TABS: 1.0000 g | ORAL_TABLET | Freq: Three times a day (TID) | ORAL | 0 refills | Status: DC

## 2021-01-15 MED ORDER — ALUM & MAG HYDROXIDE-SIMETH 200-200-20 MG/5ML PO SUSP
30.0000 mL | Freq: Once | ORAL | Status: AC
  Administered 2021-01-15: 30 mL via ORAL
  Filled 2021-01-15: qty 30

## 2021-01-15 MED ORDER — SUCRALFATE 1 G PO TABS
1.0000 g | ORAL_TABLET | Freq: Once | ORAL | Status: AC
  Administered 2021-01-15: 1 g via ORAL
  Filled 2021-01-15: qty 1

## 2021-01-15 MED ORDER — LIDOCAINE VISCOUS HCL 2 % MT SOLN
15.0000 mL | Freq: Once | OROMUCOSAL | Status: AC
  Administered 2021-01-15: 15 mL via ORAL
  Filled 2021-01-15: qty 15

## 2021-01-15 NOTE — ED Provider Notes (Signed)
[facility] EMERGENCY DEPARTMENT Provider Note   CSN: [id number] Arrival date & time: 01/15/21  1620     History No chief complaint on file.   [name] is a 35 y.o. female.  HPI 35 year old female with a history of chlamydia, HPV presents to the ER with complaints of 2 days of epigastric pain and feeling bloated.  She reports nausea, but no vomiting.  She states that she feels like her belly is so bloated that she looks pregnant.  She feels like she has "food gets stuck in her upper abdomen when she eats".  Pain is worse with standing and walking, as well as eating.  She was seen in urgent care and referred here for possible CT imaging and further evaluation of her pain.  She does still have her gallbladder.  She states that she does take 2-4 ibuprofen or Aleve daily for the last year.  She states that she works on her feet a lot and takes a lot of pain medicine for her back.  She states she has also been having some diarrhea, which has been watery.  No dysuria or hematuria.  No fevers or chills, no chest pain, shortness of breath, pelvic pain, vaginal bleeding, discharge    Past Medical History:  Diagnosis Date  . Chlamydia 2009  . HPV (human papilloma virus) infection   . Vaginal Pap smear, abnormal     There are no problems to display for this patient.   History reviewed. No pertinent surgical history.   OB History    Gravida  3   Para  3   Term  3   Preterm      AB      Living  3     SAB      IAB      Ectopic      Multiple      Live Births  3           No family history on file.  Social History   Tobacco Use  . Smoking status: Current Every Day Smoker    Packs/day: 1.00  . Smokeless tobacco: Never Used  Substance Use Topics  . Alcohol use: Yes    Comment: occasional  . Drug use: No    Home Medications Prior to Admission medications   Medication Sig Start Date End Date Taking? Authorizing Provider  omeprazole  (PRILOSEC) 20 MG capsule Take 1 capsule (20 mg total) by mouth daily. 01/15/21  Yes Mare Ferrari, PA-C  sucralfate (CARAFATE) 1 g tablet Take 1 tablet (1 g total) by mouth 4 (four) times daily -  with meals and at bedtime. 01/15/21 02/14/21 Yes Mare Ferrari, PA-C  amoxicillin (AMOXIL) 500 MG capsule Take 1 capsule (500 mg total) by mouth 3 (three) times daily. 07/09/15   Menshew, Charlesetta Ivory, PA-C  benzonatate (TESSALON PERLES) 100 MG capsule Take 1 capsule (100 mg total) by mouth 3 (three) times daily as needed for cough (Take 1-2 per dose). 07/09/15   Menshew, Charlesetta Ivory, PA-C  fluticasone (FLONASE) 50 MCG/ACT nasal spray Place 1 spray into both nostrils daily. 07/09/15   Menshew, Charlesetta Ivory, PA-C  medroxyPROGESTERone (PROVERA) 10 MG tablet Take 1 tablet (10 mg total) by mouth daily. 02/27/15   Marny Lowenstein, PA-C  naproxen (NAPROSYN) 500 MG tablet Take 1 tablet (500 mg total) by mouth 2 (two) times daily with a meal. 10/10/20   Wallis Bamberg, PA-C  traMADol (  ULTRAM) 50 MG tablet Take 1 tablet (50 mg total) by mouth every 6 (six) hours as needed for moderate pain. 06/04/15   Joni Reining, PA-C    Allergies    Cocoa butter, Chocolate, and Codeine  Review of Systems   Review of Systems  Constitutional: Negative for chills and fever.  HENT: Negative for ear pain and sore throat.   Eyes: Negative for pain and visual disturbance.  Respiratory: Negative for cough and shortness of breath.   Cardiovascular: Negative for chest pain and palpitations.  Gastrointestinal: Positive for abdominal pain, diarrhea and nausea. Negative for vomiting.  Genitourinary: Negative for dysuria and hematuria.  Musculoskeletal: Negative for arthralgias and back pain.  Skin: Negative for color change and rash.  Neurological: Negative for seizures and syncope.  All other systems reviewed and are negative.   Physical Exam Updated Vital Signs BP (!) 126/94 (BP Location: Left Arm)   Pulse 73   Temp 98.6 F  (37 C)   Resp 16   Ht 5' (1.524 m)   Wt 66.2 kg   LMP 01/12/2021 (Exact Date) Comment: NEG HCG 01/15/21  SpO2 97%   BMI 28.50 kg/m   Physical Exam Vitals and nursing note reviewed.  Constitutional:      General: She is not in acute distress.    Appearance: She is well-developed. She is not ill-appearing, toxic-appearing or diaphoretic.  HENT:     Head: Normocephalic and atraumatic.  Eyes:     Conjunctiva/sclera: Conjunctivae normal.  Cardiovascular:     Rate and Rhythm: Normal rate and regular rhythm.     Heart sounds: No murmur heard.   Pulmonary:     Effort: Pulmonary effort is normal. No respiratory distress.     Breath sounds: Normal breath sounds.  Abdominal:     Palpations: Abdomen is soft.     Tenderness: There is abdominal tenderness. There is no right CVA tenderness or left CVA tenderness.     Comments: Right upper quadrant and epigastric tenderness to palpation.  Right lower quadrant or left lower quadrant pain.  No CVA tenderness.  Musculoskeletal:        General: Normal range of motion.     Cervical back: Neck supple.  Skin:    General: Skin is warm and dry.     Capillary Refill: Capillary refill takes less than 2 seconds.  Neurological:     General: No focal deficit present.     Mental Status: She is alert and oriented to person, place, and time.  Psychiatric:        Mood and Affect: Mood normal.        Behavior: Behavior normal.     ED Results / Procedures / Treatments   Labs (all labs ordered are listed, but only abnormal results are displayed) Labs Reviewed  COMPREHENSIVE METABOLIC PANEL - Abnormal; Notable for the following components:      Result Value   Glucose, Bld 100 (*)    All other components within normal limits  LIPASE, BLOOD  CBC  URINALYSIS, ROUTINE W REFLEX MICROSCOPIC  I-STAT BETA HCG BLOOD, ED ([facility], [facility], [facility] ONLY)    EKG EKG Interpretation  Date/Time:  Tuesday January 15 2021 16:33:27 EDT Ventricular Rate:  81 PR  Interval:  162 QRS Duration: 74 QT Interval:  364 QTC Calculation: 422 R Axis:   56 Text Interpretation: Normal sinus rhythm with sinus arrhythmia Normal ECG No STEMI Confirmed by Alvester Chou [phone]) on 01/15/2021 5:02:46 PM   Radiology CT  ABDOMEN PELVIS W CONTRAST  Result Date: 01/15/2021 CLINICAL DATA:  epigastric pain EXAM: CT ABDOMEN AND PELVIS WITH CONTRAST TECHNIQUE: Multidetector CT imaging of the abdomen and pelvis was performed using the standard protocol following bolus administration of intravenous contrast. CONTRAST:  OMNIPAQUE IOHEXOL 300 MG/ML  SOLN COMPARISON:  01/22/2007 FINDINGS: Lower chest: No acute abnormality. Hepatobiliary: No focal liver abnormality is seen. No gallstones, gallbladder wall thickening, or biliary dilatation. Pancreas: Unremarkable. No pancreatic ductal dilatation or surrounding inflammatory changes. Spleen: Normal in size without focal abnormality. Adrenals/Urinary Tract: Normal adrenal glands. No kidney mass or hydronephrosis. Small cyst arises off the anterior cortex of the inferior pole of left kidney measuring 7 mm. Urinary bladder appears unremarkable. Stomach/Bowel: Tiny hiatal hernia. Stomach otherwise unremarkable. The appendix is visualized and appears normal. No bowel wall thickening, inflammation, or distension. Vascular/Lymphatic: Normal appearance of the abdominal aorta. No aneurysm. Prominent right gonadal vein. No signs of thrombosis however. No abdominopelvic adenopathy. Reproductive: Uterus and bilateral adnexa are unremarkable. Other: No free fluid or fluid collections. Musculoskeletal: No acute or significant osseous findings. IMPRESSION: 1. No acute findings within the abdomen or pelvis. 2. Tiny hiatal hernia. Electronically Signed   By: Signa Kell M.D.   On: 01/15/2021 19:07    Procedures Procedures   Medications Ordered in ED Medications  alum & mag hydroxide-simeth (MAALOX/MYLANTA) 200-200-20 MG/5ML suspension 30 mL (30 mLs  Oral Given 01/15/21 1757)    And  lidocaine (XYLOCAINE) 2 % viscous mouth solution 15 mL (15 mLs Oral Given 01/15/21 1757)  sucralfate (CARAFATE) tablet 1 g (1 g Oral Given 01/15/21 1757)  ondansetron (ZOFRAN-ODT) disintegrating tablet 4 mg (4 mg Oral Given 01/15/21 1758)  iohexol (OMNIPAQUE) 300 MG/ML solution 100 mL (100 mLs Intravenous Contrast Given 01/15/21 1836)    ED Course  I have reviewed the triage vital signs and the nursing notes.  Pertinent labs & imaging results that were available during my care of the patient were reviewed by me and considered in my medical decision making (see chart for details).    MDM Rules/Calculators/A&P                          35 year old female presents with several days of epigastric pain, nausea, diarrhea.  She does have a history of heavy NSAID use. Vitals on arrival overall reassuring, no evidence of hypotension, tachycardia, tachypnea or hypoxia.  Afebrile.  Physical exam with epigastric tenderness as well as right upper quadrant tenderness.  Negative Murphy's.  DDx includes GERD, cholecystitis, PUD, perforated gastric ulcer, viral gastroenteritis  Lower suspicion for any pelvic pathology given no lower abdominal pain  Lab work and imaging ordered, reviewed and interpreted by me -CBC without leukocytosis, normal hemoglobin.  CMP without any electrode abnormalities, normal LFTs, normal bilirubin.  Normal lipase.  Pregnancy test is negative  CT of the abdomen overall reassuring.  No evidence of gallstones.  Low suspicion for dissection as patient has no chest pain, dizziness, syncope, shortness of breath, no risk factors.  Patient was given GI cocktail, Carafate, notes moderate improvement in symptoms.  No evidence of perforated ulcer.  PUD/gastric ulcer still on the differential given heavy NSAID use.  Patient was instructed to stop all NSAID use, will give a PPI and Carafate prescription.  Patient is uninsured, will give [place] and wellness  information for follow-up.  She would ideally benefit from GI follow-up given no insurance, will start with [place] and wellness.  We discussed  return precautions.  She voiced understanding and is agreeable.  Stable for discharge at this time.   Final Clinical Impression(s) / ED Diagnoses Final diagnoses:  Epigastric pain    Rx / DC Orders ED Discharge Orders         Ordered    omeprazole (PRILOSEC) 20 MG capsule  Daily        01/15/21 2035    sucralfate (CARAFATE) 1 g tablet  3 times daily with meals & bedtime        01/15/21 2035           Leone BrandBelaya, Ross Hefferan A, PA-C 01/15/21 2036    Terald Sleeperrifan, Matthew J, MD 01/16/21 [phone]

## 2021-01-15 NOTE — ED Provider Notes (Signed)
[facility]    CSN: [id number] Arrival date & time: 01/15/21  1026      History   Chief Complaint Chief Complaint  Patient presents with  . Abdominal Pain  . Chills  . Diarrhea    HPI [name] is a 35 y.o. female presenting with epigastric pain and bloating x3 days.  History of chlamydia, HPV, abnormal Pap smear, current smoker.  Describes 3 days of epigastric pain, worse with eating but relieved by empty stomaoch.  Also nausea without vomiting.  5 episodes of watery diarrhea daily.  Denies BRBPR or dark/tarry stool, but states she is on her periods and is hard to tell.  Denies hematemesis.  Denies right-sided abdominal pain. States that she thinks she has GERD, has been treating this with Tums over-the-counter; has tried this for current symptoms without improvement.  Also with family history of peptic ulcer disease. She is a cigarette smoker.  Feeling well otherwise, denies URI symptoms, fever/chills, cough, congestion. No PCP.  HPI  Past Medical History:  Diagnosis Date  . Chlamydia 2009  . HPV (human papilloma virus) infection   . Vaginal Pap smear, abnormal     There are no problems to display for this patient.   History reviewed. No pertinent surgical history.  OB History    Gravida  3   Para  3   Term  3   Preterm      AB      Living  3     SAB      IAB      Ectopic      Multiple      Live Births  3            Home Medications    Prior to Admission medications   Medication Sig Start Date End Date Taking? Authorizing Provider  amoxicillin (AMOXIL) 500 MG capsule Take 1 capsule (500 mg total) by mouth 3 (three) times daily. 07/09/15   Menshew, Charlesetta Ivory, PA-C  benzonatate (TESSALON PERLES) 100 MG capsule Take 1 capsule (100 mg total) by mouth 3 (three) times daily as needed for cough (Take 1-2 per dose). 07/09/15   Menshew, Charlesetta Ivory, PA-C  fluticasone (FLONASE) 50 MCG/ACT nasal spray Place 1 spray into both nostrils  daily. 07/09/15   Menshew, Charlesetta Ivory, PA-C  medroxyPROGESTERone (PROVERA) 10 MG tablet Take 1 tablet (10 mg total) by mouth daily. 02/27/15   Marny Lowenstein, PA-C  naproxen (NAPROSYN) 500 MG tablet Take 1 tablet (500 mg total) by mouth 2 (two) times daily with a meal. 10/10/20   Wallis Bamberg, PA-C  traMADol (ULTRAM) 50 MG tablet Take 1 tablet (50 mg total) by mouth every 6 (six) hours as needed for moderate pain. 06/04/15   Joni Reining, PA-C    Family History History reviewed. No pertinent family history.  Social History Social History   Tobacco Use  . Smoking status: Current Every Day Smoker    Packs/day: 1.00  . Smokeless tobacco: Never Used  Substance Use Topics  . Alcohol use: Yes    Comment: occasional  . Drug use: No     Allergies   Cocoa butter, Chocolate, and Codeine   Review of Systems Review of Systems  Constitutional: Negative for appetite change, chills, diaphoresis, fever and unexpected weight change.  HENT: Negative for congestion, ear pain, sinus pressure, sinus pain, sneezing, sore throat and trouble swallowing.   Respiratory: Negative for cough, chest tightness and shortness  of breath.   Cardiovascular: Negative for chest pain.  Gastrointestinal: Positive for abdominal pain, diarrhea and nausea. Negative for abdominal distention, anal bleeding, blood in stool, constipation, rectal pain and vomiting.  Genitourinary: Negative for decreased urine volume, dysuria, enuresis, flank pain, frequency, genital sores, hematuria, menstrual problem, pelvic pain, urgency, vaginal bleeding, vaginal discharge and vaginal pain.  Musculoskeletal: Negative for back pain and myalgias.  Neurological: Negative for dizziness, light-headedness and headaches.  All other systems reviewed and are negative.    Physical Exam Triage Vital Signs ED Triage Vitals  Enc Vitals Group     BP      Pulse      Resp      Temp      Temp src      SpO2      Weight      Height      Head  Circumference      Peak Flow      Pain Score      Pain Loc      Pain Edu?      Excl. in GC?    No data found.  Updated Vital Signs BP 117/71 (BP Location: Left Arm)   Pulse 72   Temp 98 F (36.7 C) (Oral)   Resp 18   LMP 01/12/2021 (Exact Date)   SpO2 98%   Visual Acuity Right Eye Distance:   Left Eye Distance:   Bilateral Distance:    Right Eye Near:   Left Eye Near:    Bilateral Near:     Physical Exam Vitals reviewed.  Constitutional:      General: She is not in acute distress.    Appearance: Normal appearance. She is well-developed. She is not ill-appearing.  HENT:     Head: Normocephalic and atraumatic.     Comments: Moist mucous membranes Good skin turgor Cardiovascular:     Rate and Rhythm: Normal rate and regular rhythm.     Heart sounds: Normal heart sounds.  Pulmonary:     Effort: Pulmonary effort is normal.     Breath sounds: Normal breath sounds. No wheezing, rhonchi or rales.  Abdominal:     General: Abdomen is protuberant. Bowel sounds are normal. There is distension.     Palpations: Abdomen is soft. There is no mass.     Tenderness: There is abdominal tenderness in the epigastric area and periumbilical area. There is rebound. There is no right CVA tenderness, left CVA tenderness or guarding. Negative signs include Murphy's sign, Rovsing's sign and McBurney's sign.     Hernia: No hernia is present.     Comments: Abd is protuberant and mildly distended. Significant epigastric tenderness. Periumbilical rebound.  Skin:    Capillary Refill: Capillary refill takes less than 2 seconds.  Neurological:     General: No focal deficit present.     Mental Status: She is alert and oriented to person, place, and time.  Psychiatric:        Mood and Affect: Mood normal.        Behavior: Behavior normal.      UC Treatments / Results  Labs (all labs ordered are listed, but only abnormal results are displayed) Labs Reviewed - No data to  display  EKG   Radiology No results found.  Procedures Procedures (including critical care time)  Medications Ordered in UC Medications - No data to display  Initial Impression / Assessment and Plan / UC Course  I have reviewed the triage vital signs  and the nursing notes.  Pertinent labs & imaging results that were available during my care of the patient were reviewed by me and considered in my medical decision making (see chart for details).     This patient is a 34 year old female presenting with gastritis. Today she is  afebrile nontachycardic nontachypneic, oxygenating well on room air.  Epigastric area is significantly tender to palpation with some rebound tenderness periumbilically.  Suspect that this patient has untreated GERD with exacerbation due to viral gastroenteritis.  However given rebound tenderness, distension, and significant tenderness to palpation- I am recommending that she go to [facility], ER for further evaluation and management of abdominal pain.  Cannot rule out peptic ulcer with perforation.  Cholecystitis or appendicitis is less likely given lack of right-sided tenderness.  She is currently hemodynamically stable for transport in personal vehicle.  She verbalizes understanding and agreement.  No PCP; referral placed.  This chart was dictated using voice recognition software, Dragon. Despite the best efforts of this provider to proofread and correct errors, errors may still occur which can change documentation meaning.   Final Clinical Impressions(s) / UC Diagnoses   Final diagnoses:  Epigastric pain  Acute gastritis, presence of bleeding unspecified, unspecified gastritis type  Current smoker     Discharge Instructions     -Please head to [facility], ER for further evaluation and management of abdominal pain. -If you experience worsening of abdominal pain, dizziness, or new symptoms that concern you like chest pain or shortness of breath-call  911.    ED Prescriptions    None     PDMP not reviewed this encounter.   Rhys Martini, PA-C 01/15/21 1220

## 2021-01-15 NOTE — Discharge Instructions (Addendum)
-  Please head to [facility], ER for further evaluation and management of abdominal pain. -If you experience worsening of abdominal pain, dizziness, or new symptoms that concern you like chest pain or shortness of breath-call 911.

## 2021-01-15 NOTE — ED Triage Notes (Signed)
Pt c/o nausea, diarrhea and mid abdominal pain x 3 days.  Pt states she has been bloated. Pt states she has not been able to eat. Pt states she is more in pain when standing up and walking around.

## 2021-01-15 NOTE — ED Triage Notes (Signed)
C/o upper abd, epigastric pain, nausea, diarrhea, and feeling bloated since yesterday.  States she feels like food gets stuck in upper abd.  Pain worse with standing and walking.

## 2021-01-15 NOTE — Discharge Instructions (Signed)
Your CT scan today was overall reassuring, although we could not rule out possible peptic ulcer disease.  You need to make sure you stop taking all anti-inflammatories for the foreseeable future.  You may take Tylenol for pain.  Please take the omeprazole and Carafate daily.  Please read the handout on peptic ulcer disease please follow-up with [facility] which is a free clinic in the area.  Return to the ER for any new or worsening symptoms.

## 2021-01-15 NOTE — ED Notes (Signed)
Patient is being discharged from the Urgent Care and sent to the Emergency Department via POV . Per Ignacia Bayley, patient is in need of higher level of care due to Abdominal Pain. Patient is aware and verbalizes understanding of plan of care.  Vitals:   01/15/21 1135  BP: 117/71  Pulse: 72  Resp: 18  Temp: 98 F (36.7 C)  SpO2: 98%

## 2021-07-05 ENCOUNTER — Encounter (HOSPITAL_COMMUNITY): Payer: Self-pay | Admitting: Emergency Medicine

## 2021-07-05 ENCOUNTER — Other Ambulatory Visit: Payer: Self-pay

## 2021-07-05 ENCOUNTER — Ambulatory Visit (HOSPITAL_COMMUNITY)
Admission: EM | Admit: 2021-07-05 | Discharge: 2021-07-05 | Disposition: A | Discharge status: Home / Self Care | Payer: Medicaid Other | Attending: Emergency Medicine | Admitting: Emergency Medicine

## 2021-07-05 DIAGNOSIS — J029 Acute pharyngitis, unspecified: Secondary | ICD-10-CM | POA: Insufficient documentation

## 2021-07-05 DIAGNOSIS — H66001 Acute suppurative otitis media without spontaneous rupture of ear drum, right ear: Secondary | ICD-10-CM

## 2021-07-05 LAB — POCT RAPID STREP A, ED / UC: Streptococcus, Group A Screen (Direct): NEGATIVE

## 2021-07-05 MED ORDER — AMOXICILLIN 500 MG PO CAPS: 500.0000 mg | ORAL_CAPSULE | Freq: Two times a day (BID) | ORAL | 0 refills | Status: AC

## 2021-07-05 NOTE — Discharge Instructions (Addendum)
Take the amoxicilin twice a day for the next 7 days.   You can take Tylenol and/or Ibuprofen as needed for fever reduction and pain relief.   For cough: honey 1/2 to 1 teaspoon (you can dilute the honey in water or another fluid).  You can also use guaifenesin and dextromethorphan for cough. You can use a humidifier for chest congestion and cough.  If you don't have a humidifier, you can sit in the bathroom with the hot shower running.     For sore throat: try warm salt water gargles, cepacol lozenges, throat spray, warm tea or water with lemon/honey, popsicles or ice, or OTC cold relief medicine for throat discomfort.    For congestion: take a daily anti-histamine like Zyrtec, Claritin, and a oral decongestant, such as pseudoephedrine.  You can also use Flonase 1-2 sprays in each nostril daily.    It is important to stay hydrated: drink plenty of fluids (water, gatorade/powerade/pedialyte, juices, or teas) to keep your throat moisturized and help further relieve irritation/discomfort.   Return or go to the Emergency Department if symptoms worsen or do not improve in the next few days.

## 2021-07-05 NOTE — ED Provider Notes (Signed)
[facility]    CSN: [id number] Arrival date & time: 07/05/21  1247      History   Chief Complaint Chief Complaint  Patient presents with   Sore Throat   Otalgia    HPI [name] is a 35 y.o. female.   Patient here for evaluation of sore throat and right ear pain that has been ongoing for the past week.  Reports daughter has similar symptoms.  Reports taking OTC medication with minimal symptom relief.  Reports taking multiple COVID tests that have all been negative and states daughter was tested at doctor's office and her COVID test was also negative.  Denies any trauma, injury, or other precipitating event.  Denies any specific alleviating or aggravating factors.  Denies any fevers, chest pain, shortness of breath, N/V/D, numbness, tingling, weakness, abdominal pain, or headaches.    The history is provided by the patient.  Sore Throat  Otalgia Associated symptoms: congestion, fever and sore throat    Past Medical History:  Diagnosis Date   Chlamydia 2009   HPV (human papilloma virus) infection    Vaginal Pap smear, abnormal     There are no problems to display for this patient.   History reviewed. No pertinent surgical history.  OB History     Gravida  3   Para  3   Term  3   Preterm      AB      Living  3      SAB      IAB      Ectopic      Multiple      Live Births  3            Home Medications    Prior to Admission medications   Medication Sig Start Date End Date Taking? Authorizing Provider  amoxicillin (AMOXIL) 500 MG capsule Take 1 capsule (500 mg total) by mouth 2 (two) times daily for 7 days. 07/05/21 07/12/21 Yes Ivette Loyal, NP  benzonatate (TESSALON PERLES) 100 MG capsule Take 1 capsule (100 mg total) by mouth 3 (three) times daily as needed for cough (Take 1-2 per dose). 07/09/15   Menshew, Charlesetta Ivory, PA-C  fluticasone (FLONASE) 50 MCG/ACT nasal spray Place 1 spray into both nostrils daily. 07/09/15    Menshew, Charlesetta Ivory, PA-C  medroxyPROGESTERone (PROVERA) 10 MG tablet Take 1 tablet (10 mg total) by mouth daily. 02/27/15   Marny Lowenstein, PA-C  naproxen (NAPROSYN) 500 MG tablet Take 1 tablet (500 mg total) by mouth 2 (two) times daily with a meal. 10/10/20   Wallis Bamberg, PA-C  omeprazole (PRILOSEC) 20 MG capsule Take 1 capsule (20 mg total) by mouth daily. 01/15/21   Terald Sleeper, MD  sucralfate (CARAFATE) 1 g tablet Take 1 tablet (1 g total) by mouth 4 (four) times daily -  with meals and at bedtime. 01/15/21 02/14/21  Terald Sleeper, MD  traMADol (ULTRAM) 50 MG tablet Take 1 tablet (50 mg total) by mouth every 6 (six) hours as needed for moderate pain. 06/04/15   Joni Reining, PA-C    Family History History reviewed. No pertinent family history.  Social History Social History   Tobacco Use   Smoking status: Every Day    Packs/day: 1.00    Types: Cigarettes   Smokeless tobacco: Never  Substance Use Topics   Alcohol use: Yes    Comment: occasional   Drug use: No  Allergies   Cocoa butter, Chocolate, and Codeine   Review of Systems Review of Systems  Constitutional:  Positive for chills, fatigue and fever.  HENT:  Positive for congestion, ear pain and sore throat. Negative for trouble swallowing and voice change.   All other systems reviewed and are negative.   Physical Exam Triage Vital Signs ED Triage Vitals  Enc Vitals Group     BP 07/05/21 1306 113/88     Pulse Rate 07/05/21 1306 88     Resp 07/05/21 1306 16     Temp 07/05/21 1306 99 F (37.2 C)     Temp Source 07/05/21 1306 Oral     SpO2 07/05/21 1306 98 %     Weight --      Height --      Head Circumference --      Peak Flow --      Pain Score 07/05/21 1304 6     Pain Loc --      Pain Edu? --      Excl. in GC? --    No data found.  Updated Vital Signs BP 113/88 (BP Location: Right Arm)   Pulse 88   Temp 99 F (37.2 C) (Oral)   Resp 16   LMP 06/20/2021 (Approximate)   SpO2 98%    Visual Acuity Right Eye Distance:   Left Eye Distance:   Bilateral Distance:    Right Eye Near:   Left Eye Near:    Bilateral Near:     Physical Exam Vitals and nursing note reviewed.  Constitutional:      General: She is not in acute distress.    Appearance: Normal appearance. She is not ill-appearing, toxic-appearing or diaphoretic.  HENT:     Head: Normocephalic and atraumatic.     Right Ear: A middle ear effusion is present. Tympanic membrane is injected, erythematous and bulging.     Left Ear: Tympanic membrane and ear canal normal.     Nose: Congestion and rhinorrhea present.     Mouth/Throat:     Pharynx: Uvula midline. Pharyngeal swelling and posterior oropharyngeal erythema present. No oropharyngeal exudate.     Tonsils: No tonsillar exudate or tonsillar abscesses. 1+ on the right. 1+ on the left.  Eyes:     Conjunctiva/sclera: Conjunctivae normal.  Cardiovascular:     Rate and Rhythm: Normal rate.     Pulses: Normal pulses.  Pulmonary:     Effort: Pulmonary effort is normal.  Abdominal:     General: Abdomen is flat.  Musculoskeletal:        General: Normal range of motion.     Cervical back: Normal range of motion.  Skin:    General: Skin is warm and dry.  Neurological:     General: No focal deficit present.     Mental Status: She is alert and oriented to person, place, and time.  Psychiatric:        Mood and Affect: Mood normal.     UC Treatments / Results  Labs (all labs ordered are listed, but only abnormal results are displayed) Labs Reviewed  CULTURE, GROUP A STREP [facility])  POCT RAPID STREP A, ED / UC    EKG   Radiology No results found.  Procedures Procedures (including critical care time)  Medications Ordered in UC Medications - No data to display  Initial Impression / Assessment and Plan / UC Course  I have reviewed the triage vital signs and the nursing notes.  Pertinent labs &  imaging results that were available during my care  of the patient were reviewed by me and considered in my medical decision making (see chart for details).    Assessment negative for red flags or concerns.  Rapid strep negative, throat culture pending.  Otitis media of the right ear.  Will treat with amoxicillin twice daily for the next 7 days.  Pharyngitis.  May take Tylenol and/or ibuprofen as needed.  Discussed conservative symptom management as described in discharge instructions.  Encourage fluids and rest.  Follow-up as needed Final Clinical Impressions(s) / UC Diagnoses   Final diagnoses:  Non-recurrent acute suppurative otitis media of right ear without spontaneous rupture of tympanic membrane  Pharyngitis, unspecified etiology     Discharge Instructions      Take the amoxicilin twice a day for the next 7 days.   You can take Tylenol and/or Ibuprofen as needed for fever reduction and pain relief.   For cough: honey 1/2 to 1 teaspoon (you can dilute the honey in water or another fluid).  You can also use guaifenesin and dextromethorphan for cough. You can use a humidifier for chest congestion and cough.  If you don't have a humidifier, you can sit in the bathroom with the hot shower running.     For sore throat: try warm salt water gargles, cepacol lozenges, throat spray, warm tea or water with lemon/honey, popsicles or ice, or OTC cold relief medicine for throat discomfort.    For congestion: take a daily anti-histamine like Zyrtec, Claritin, and a oral decongestant, such as pseudoephedrine.  You can also use Flonase 1-2 sprays in each nostril daily.    It is important to stay hydrated: drink plenty of fluids (water, gatorade/powerade/pedialyte, juices, or teas) to keep your throat moisturized and help further relieve irritation/discomfort.   Return or go to the Emergency Department if symptoms worsen or do not improve in the next few days.      ED Prescriptions     Medication Sig Dispense Auth. Provider   amoxicillin  (AMOXIL) 500 MG capsule Take 1 capsule (500 mg total) by mouth 2 (two) times daily for 7 days. 14 capsule Ivette Loyal, NP      PDMP not reviewed this encounter.   Ivette Loyal, NP 07/05/21 [phone]

## 2021-07-05 NOTE — ED Triage Notes (Signed)
Patient c/o sore throat and RT ear pain x 1 week.   Patient endorses onset of symptoms began with a sore throat, " it got better and then it returned and now it's so bad, I can barley swallow".   Patient endorses chills at times.   Patient has taken an at home COVID test with negative results.   Patient has taken OTC "cold and flu" medicine w/ no relief of symptoms.

## 2021-07-08 LAB — CULTURE, GROUP A STREP (THRC)

## 2022-01-22 ENCOUNTER — Encounter (HOSPITAL_COMMUNITY): Payer: Self-pay

## 2022-01-22 ENCOUNTER — Emergency Department (HOSPITAL_COMMUNITY): Payer: Self-pay

## 2022-01-22 ENCOUNTER — Emergency Department (HOSPITAL_COMMUNITY)
Admission: EM | Admit: 2022-01-22 | Discharge: 2022-01-22 | Disposition: A | Discharge status: Home / Self Care | Payer: Self-pay | Attending: Emergency Medicine | Admitting: Emergency Medicine

## 2022-01-22 ENCOUNTER — Other Ambulatory Visit: Payer: Self-pay

## 2022-01-22 DIAGNOSIS — M25512 Pain in left shoulder: Secondary | ICD-10-CM | POA: Insufficient documentation

## 2022-01-22 DIAGNOSIS — W1842XA Slipping, tripping and stumbling without falling due to stepping into hole or opening, initial encounter: Secondary | ICD-10-CM | POA: Insufficient documentation

## 2022-01-22 DIAGNOSIS — Y9302 Activity, running: Secondary | ICD-10-CM | POA: Insufficient documentation

## 2022-01-22 HISTORY — DX: Anxiety disorder, unspecified: F41.9

## 2022-01-22 HISTORY — DX: Cyclothymic disorder: F34.0

## 2022-01-22 MED ORDER — ONDANSETRON 4 MG PO TBDP: 4.0000 mg | ORAL_TABLET | Freq: Three times a day (TID) | ORAL | 0 refills | Status: DC | PRN

## 2022-01-22 MED ORDER — ONDANSETRON 4 MG PO TBDP
4.0000 mg | ORAL_TABLET | Freq: Once | ORAL | Status: AC
  Administered 2022-01-22: 4 mg via ORAL
  Filled 2022-01-22: qty 1

## 2022-01-22 MED ORDER — OXYCODONE-ACETAMINOPHEN 5-325 MG PO TABS
1.0000 | ORAL_TABLET | Freq: Once | ORAL | Status: AC
  Administered 2022-01-22: 1 via ORAL
  Filled 2022-01-22: qty 1

## 2022-01-22 MED ORDER — OXYCODONE-ACETAMINOPHEN 5-325 MG PO TABS: 1.0000 | ORAL_TABLET | Freq: Three times a day (TID) | ORAL | 0 refills | Status: DC | PRN

## 2022-01-22 NOTE — ED Triage Notes (Signed)
Pt reports she was walking her dog Sunday night when she fell onto her left shoulder. She states she cant raise her arm up or backwards, Pain unrelieved with tylenol. ?

## 2022-01-22 NOTE — ED Notes (Signed)
Called ortho tec for shoulder immobilizer/sling ? ?

## 2022-01-22 NOTE — ED Notes (Addendum)
Pt to CT, correction Xray ?

## 2022-01-22 NOTE — Progress Notes (Signed)
Orthopedic Tech Progress Note ?Patient Details:  ?[name] ?1986/06/08 ?[id number] ? ?Ortho Devices ?Type of Ortho Device: Shoulder immobilizer ?Ortho Device/Splint Location: LUE ?Ortho Device/Splint Interventions: Ordered, Application, Adjustment ?  ?Post Interventions ?Patient Tolerated: Well ?Instructions Provided: Adjustment of device, Care of device ? ?Darleen Crocker ?01/22/2022, 10:02 PM ? ?

## 2022-01-22 NOTE — ED Provider Notes (Signed)
?[facility] EMERGENCY DEPARTMENT ?Provider Note ? ? ?CSN: [id number] ?Arrival date & time: 01/22/22  1746 ? ?  ? ?History ? ?Chief Complaint  ?Patient presents with  ? Shoulder Injury  ? ? ?GRADY LUCCI is a 36 y.o. female. ? ?HPI ?Patient is a 36 year old female who presents to the emergency department due to left shoulder pain.  States her symptoms started 3 days ago after running with her dog.  She states that while running with her dog through a field she stepped in a hole and tripped and fell on her left shoulder.  Reports moderate pain in the left shoulder that worsens significantly with range of motion.  No head trauma or LOC.  Denies any neck or back pain.  No numbness. ?  ? ?Home Medications ?Prior to Admission medications   ?Medication Sig Start Date End Date Taking? Authorizing Provider  ?ondansetron (ZOFRAN-ODT) 4 MG disintegrating tablet Take 1 tablet (4 mg total) by mouth every 8 (eight) hours as needed for nausea or vomiting. 01/22/22  Yes Placido Sou, PA-C  ?oxyCODONE-acetaminophen (PERCOCET/ROXICET) 5-325 MG tablet Take 1 tablet by mouth every 8 (eight) hours as needed for severe pain. 01/22/22  Yes Placido Sou, PA-C  ?benzonatate (TESSALON PERLES) 100 MG capsule Take 1 capsule (100 mg total) by mouth 3 (three) times daily as needed for cough (Take 1-2 per dose). 07/09/15   Menshew, Charlesetta Ivory, PA-C  ?fluticasone (FLONASE) 50 MCG/ACT nasal spray Place 1 spray into both nostrils daily. 07/09/15   Menshew, Charlesetta Ivory, PA-C  ?medroxyPROGESTERone (PROVERA) 10 MG tablet Take 1 tablet (10 mg total) by mouth daily. 02/27/15   Marny Lowenstein, PA-C  ?naproxen (NAPROSYN) 500 MG tablet Take 1 tablet (500 mg total) by mouth 2 (two) times daily with a meal. 10/10/20   Wallis Bamberg, PA-C  ?omeprazole (PRILOSEC) 20 MG capsule Take 1 capsule (20 mg total) by mouth daily. 01/15/21   Terald Sleeper, MD  ?sucralfate (CARAFATE) 1 g tablet Take 1 tablet (1 g total) by mouth 4 (four) times  daily -  with meals and at bedtime. 01/15/21 02/14/21  Terald Sleeper, MD  ?   ? ?Allergies    ?Cocoa butter, Chocolate, and Codeine   ? ?Review of Systems   ?Review of Systems  ?Musculoskeletal:  Positive for arthralgias and myalgias. Negative for back pain and neck pain.  ?Skin:  Negative for wound.  ?Neurological:  Negative for numbness.  ? ?Physical Exam ?Updated Vital Signs ?BP 113/78   Pulse 95   Temp 98.6 ?F (37 ?C) (Oral)   Resp 16   Ht 5' (1.524 m)   Wt 74.8 kg   LMP 01/22/2022 (Approximate)   SpO2 95%   BMI 32.22 kg/m?  ?Physical Exam ?Vitals and nursing note reviewed.  ?Constitutional:   ?   General: She is not in acute distress. ?   Appearance: She is well-developed.  ?HENT:  ?   Head: Normocephalic and atraumatic.  ?   Right Ear: External ear normal.  ?   Left Ear: External ear normal.  ?Eyes:  ?   General: No scleral icterus.    ?   Right eye: No discharge.     ?   Left eye: No discharge.  ?   Conjunctiva/sclera: Conjunctivae normal.  ?Neck:  ?   Trachea: No tracheal deviation.  ?Cardiovascular:  ?   Rate and Rhythm: Normal rate.  ?Pulmonary:  ?   Effort: Pulmonary effort is normal.  No respiratory distress.  ?   Breath sounds: No stridor.  ?Abdominal:  ?   General: There is no distension.  ?Musculoskeletal:     ?   General: Tenderness present. No swelling or deformity.  ?   Cervical back: Neck supple.  ?   Comments: Moderate tenderness noted along the left shoulder circumferentially.  Additional mild tenderness noted to the surrounding musculature.  No tenderness appreciated in the left elbow or wrist.  Distal sensation intact in the left arm.  Grip strength intact.  2+ radial pulse.  Unable to assess range of motion of the left shoulder due to patient's pain.  Full range of motion of the left elbow and wrist.  ?Skin: ?   General: Skin is warm and dry.  ?   Findings: No rash.  ?Neurological:  ?   Mental Status: She is alert.  ?   Cranial Nerves: Cranial nerve deficit: no gross deficits.  ? ?ED  Results / Procedures / Treatments   ?Labs ?(all labs ordered are listed, but only abnormal results are displayed) ?Labs Reviewed - No data to display ? ?EKG ?None ? ?Radiology ?DG Shoulder Left ? ?Result Date: 01/22/2022 ?CLINICAL DATA:  Left shoulder pain. EXAM: LEFT SHOULDER - 2+ VIEW COMPARISON:  None. FINDINGS: There is no evidence of fracture or dislocation. Normal joint spaces and alignment. There is no evidence of arthropathy or other focal bone abnormality. Soft tissues are unremarkable. IMPRESSION: Negative radiographs of the left shoulder. Electronically Signed   By: Narda Rutherford M.D.   On: 01/22/2022 20:07   ? ?Procedures ?Procedures  ? ?Medications Ordered in ED ?Medications  ?oxyCODONE-acetaminophen (PERCOCET/ROXICET) 5-325 MG per tablet 1 tablet (1 tablet Oral Given 01/22/22 1813)  ?ondansetron (ZOFRAN-ODT) disintegrating tablet 4 mg (4 mg Oral Given 01/22/22 1822)  ? ? ?ED Course/ Medical Decision Making/ A&P ?  ?                        ?Medical Decision Making ?Risk ?Prescription drug management. ? ?Patient is a 36 year old female who presents to the emergency department due to left shoulder pain.  Her symptoms began 3 days ago after she tripped and fell on the left shoulder. ? ?On my exam patient has moderate tenderness along the left shoulder circumferentially with mild surrounding tenderness along the musculature.  No obvious deformity.  Neurovascularly intact in the left arm and is nontender with full range of motion of the left elbow and wrist.  X-rays were obtained in triage which are negative. ? ?Difficulty assessing range of motion of the shoulder or strength in the arm due to patient's pain.  Appears to be muscle spasms versus rotator cuff injury.  Will place in a sling for comfort.  Will discharge on a very short course of Percocet for breakthrough pain.  We discussed safety regarding this medication. ? ?Patient appears stable for discharge at this time and she is agreeable.  We discussed  return precautions.  She was given a referral to orthopedics for follow-up.  Her questions were answered and she was amicable at the time of discharge. ?Final Clinical Impression(s) / ED Diagnoses ?Final diagnoses:  ?Acute pain of left shoulder  ? ?Rx / DC Orders ?ED Discharge Orders   ? ?      Ordered  ?  oxyCODONE-acetaminophen (PERCOCET/ROXICET) 5-325 MG tablet  Every 8 hours PRN       ? 01/22/22 2021  ?  ondansetron (ZOFRAN-ODT) 4 MG disintegrating tablet  Every 8 hours PRN       ? 01/22/22 2023  ? ?  ?  ? ?  ? ? ?  ?Placido SouJoldersma, Sarajean Dessert, PA-C ?01/22/22 2028 ? ?  ?Milagros Lollykstra, Richard S, MD ?01/25/22 1615 ? ?

## 2022-01-22 NOTE — Discharge Instructions (Addendum)
I have prescribed you a strong narcotic called Percocet. Please only take this as prescribed. This medication also has tylenol in it, so please be sure you are not taking more than 3000 mg of tylenol per day. Do not drive or operate heavy machinery after taking this medication. Do not mix it with alcohol.  Do not mix this medication with Ativan. ? ?I am prescribing you a medication called Zofran.  This is a disintegrating tablet you can use up to 3 times a day for management of your nausea and vomiting.  Please only take this as prescribed.  Please only take this if you are experiencing nausea and vomiting that you cannot control. ? ?Please wear your sling as needed for comfort. ? ?Below is the contact information for Dr. Steward Drone.  Please call him and schedule an appointment for reevaluation. ? ?If you develop any new or worsening symptoms please come back to the emergency department. ?

## 2022-01-22 NOTE — ED Provider Triage Note (Signed)
Emergency Medicine Provider Triage Evaluation Note ? ?[name] , a 36 y.o. female  was evaluated in triage.  Pt complains of L shoulder pain since Sunday.  She reports that she was walking her dog when it yanked on the leash and she fell.  She been complaining of pain ever since.  She has been trying Tylenol and ibuprofen with no relief. ? ?Review of Systems  ?Positive: Left shoulder pain ?Negative: Numbness, tingling ? ?Physical Exam  ?BP 131/89 (BP Location: Right Arm)   Pulse 98   Temp 98.6 ?F (37 ?C) (Oral)   Resp 16   Ht 5' (1.524 m)   Wt 74.8 kg   LMP 01/22/2022 (Approximate)   SpO2 96%   BMI 32.22 kg/m?  ?Gen:   Awake, no distress   ?Resp:  Normal effort  ?MSK:   Moves extremities without difficulty  ?Other:  Deformity noted to the left shoulder, very edematous joint, no erythema or increased warmth, good radial pulses ? ?Medical Decision Making  ?Medically screening exam initiated at 6:09 PM.  Appropriate orders placed.  [name] was informed that the remainder of the evaluation will be completed by another provider, this initial triage assessment does not replace that evaluation, and the importance of remaining in the ED until their evaluation is complete. ? ? ?  ?Su Monks, PA-C ?01/22/22 1821 ? ?

## 2022-08-30 IMAGING — DX DG HAND COMPLETE 3+V*L*
3 series · 3 of 3 positions shown · non-contrast
Comparison: None.

CLINICAL DATA: Left hand injury, pain.  Fall.

EXAM:
LEFT HAND - COMPLETE 3+ VIEW

[hand pa]
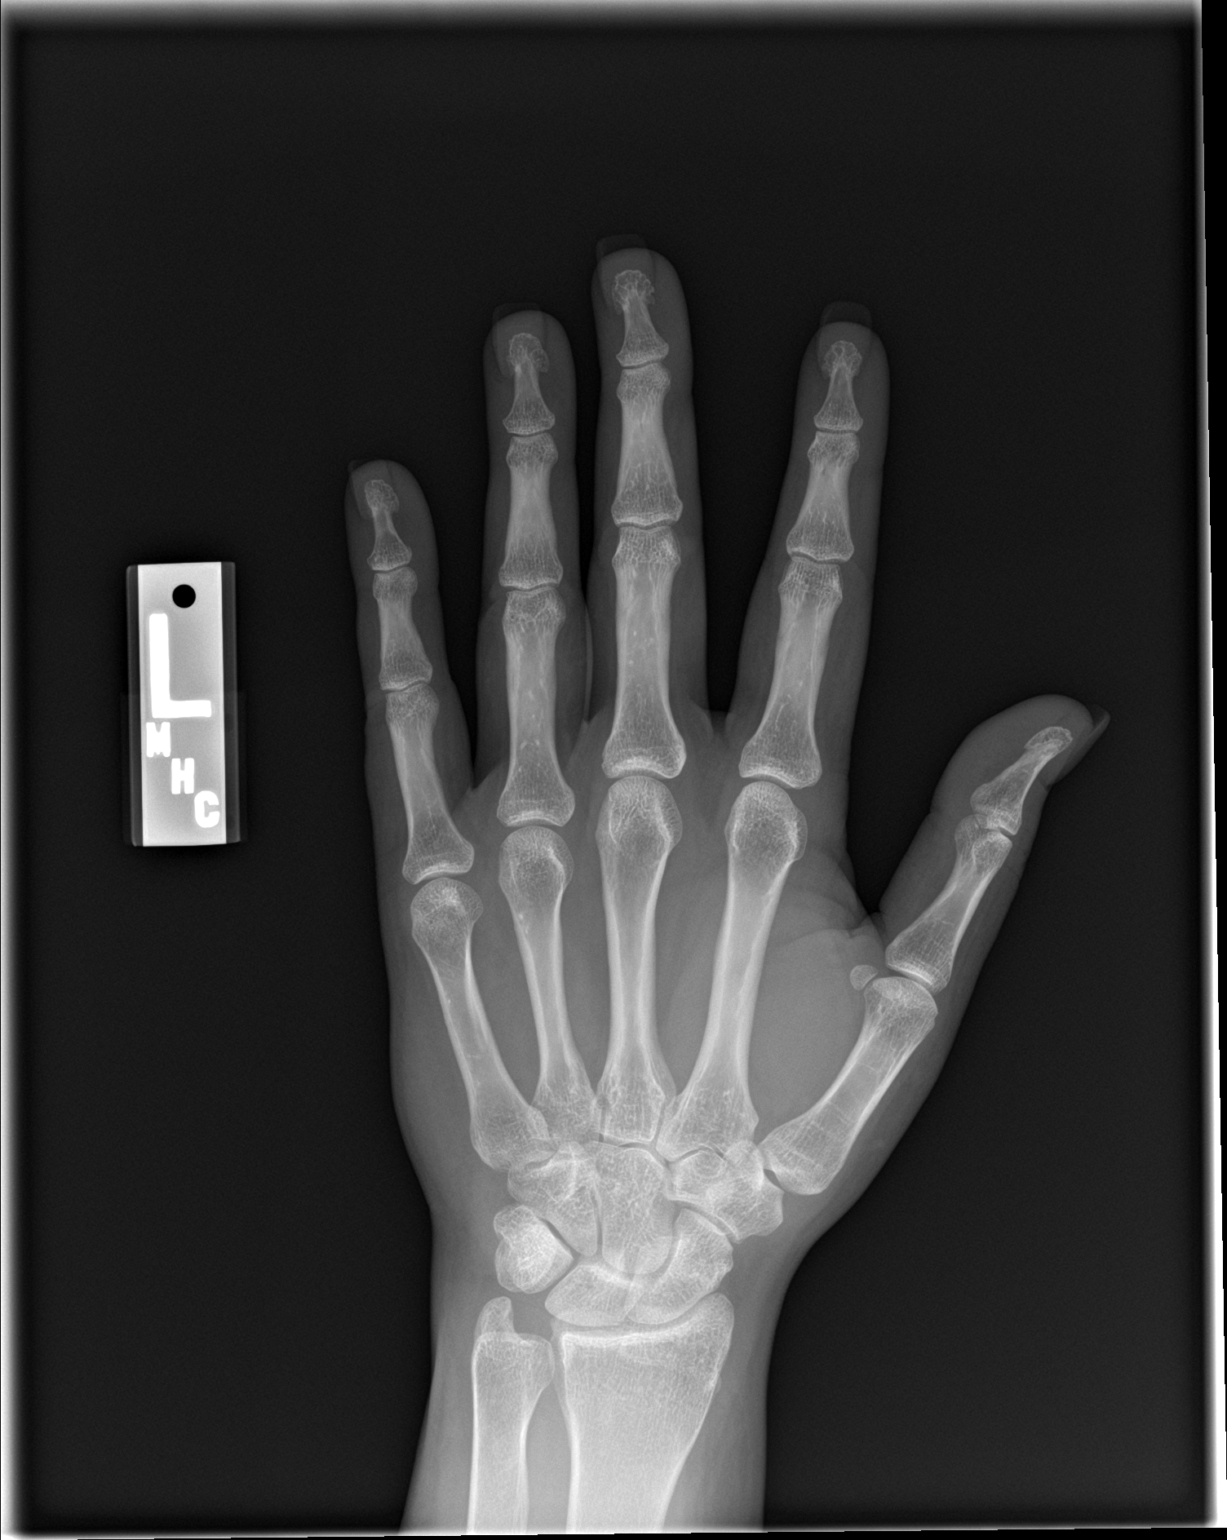

[hand obl]
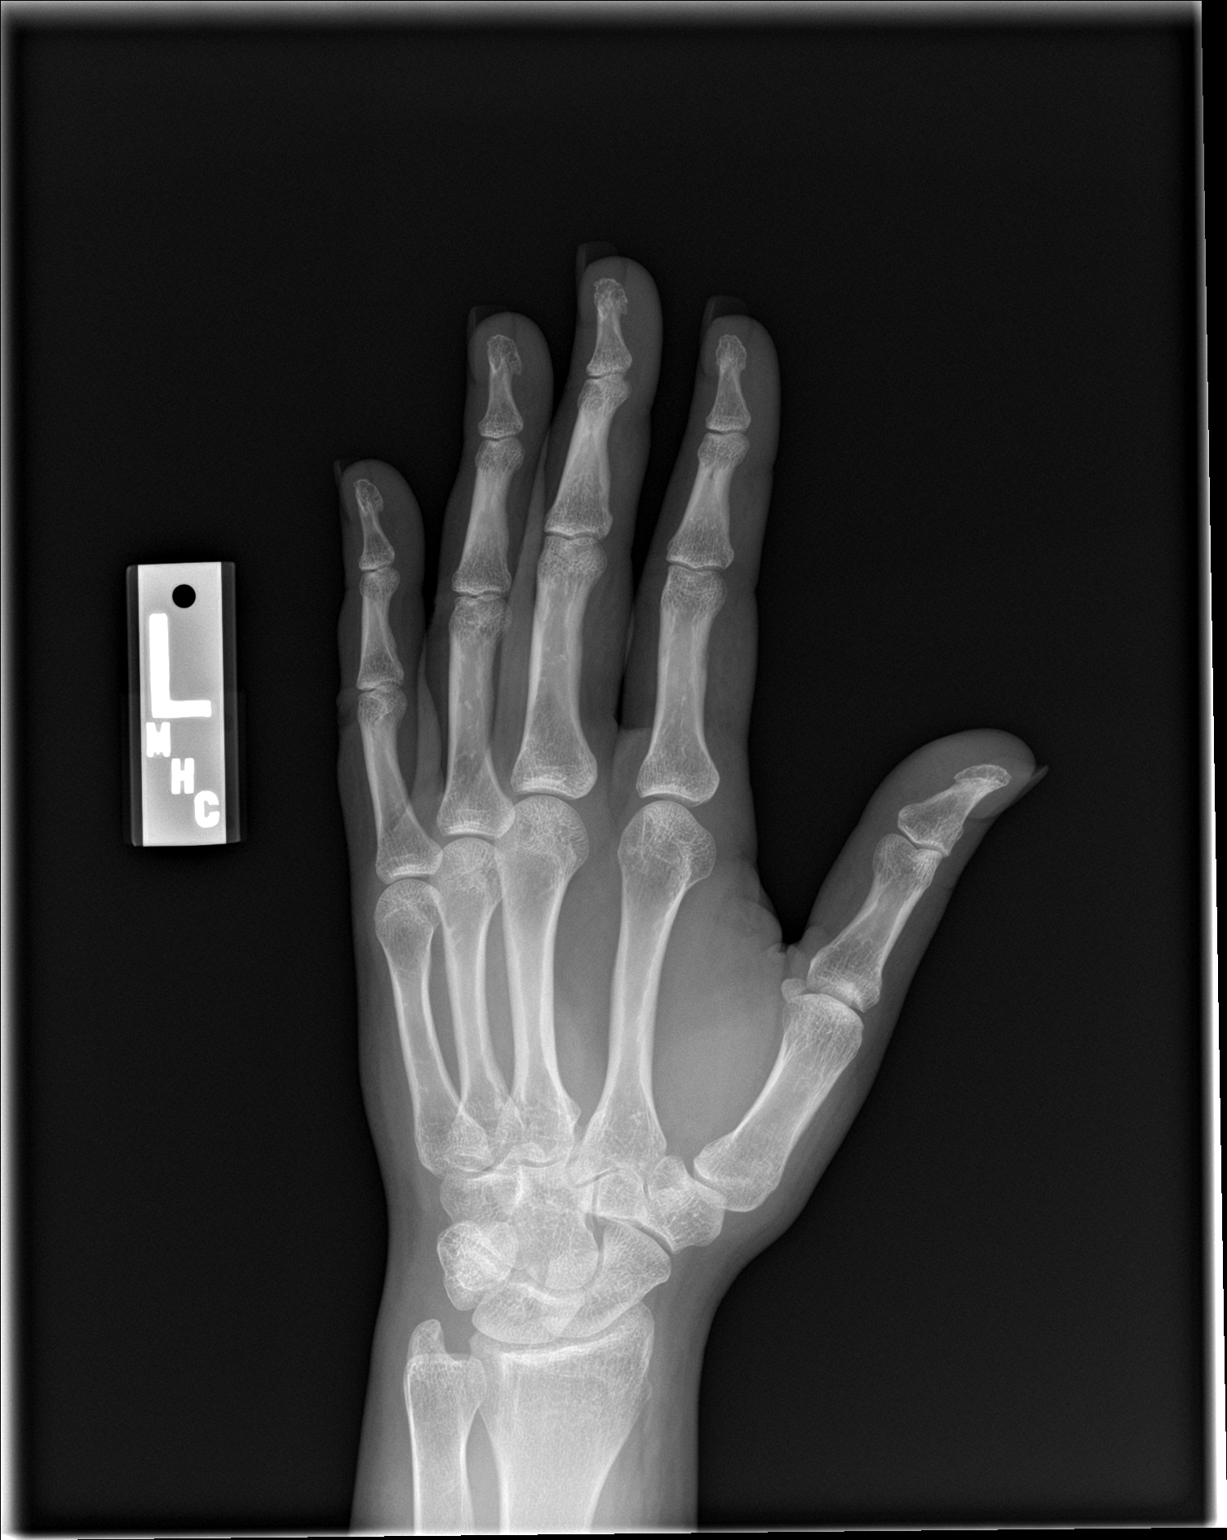

[hand lat]
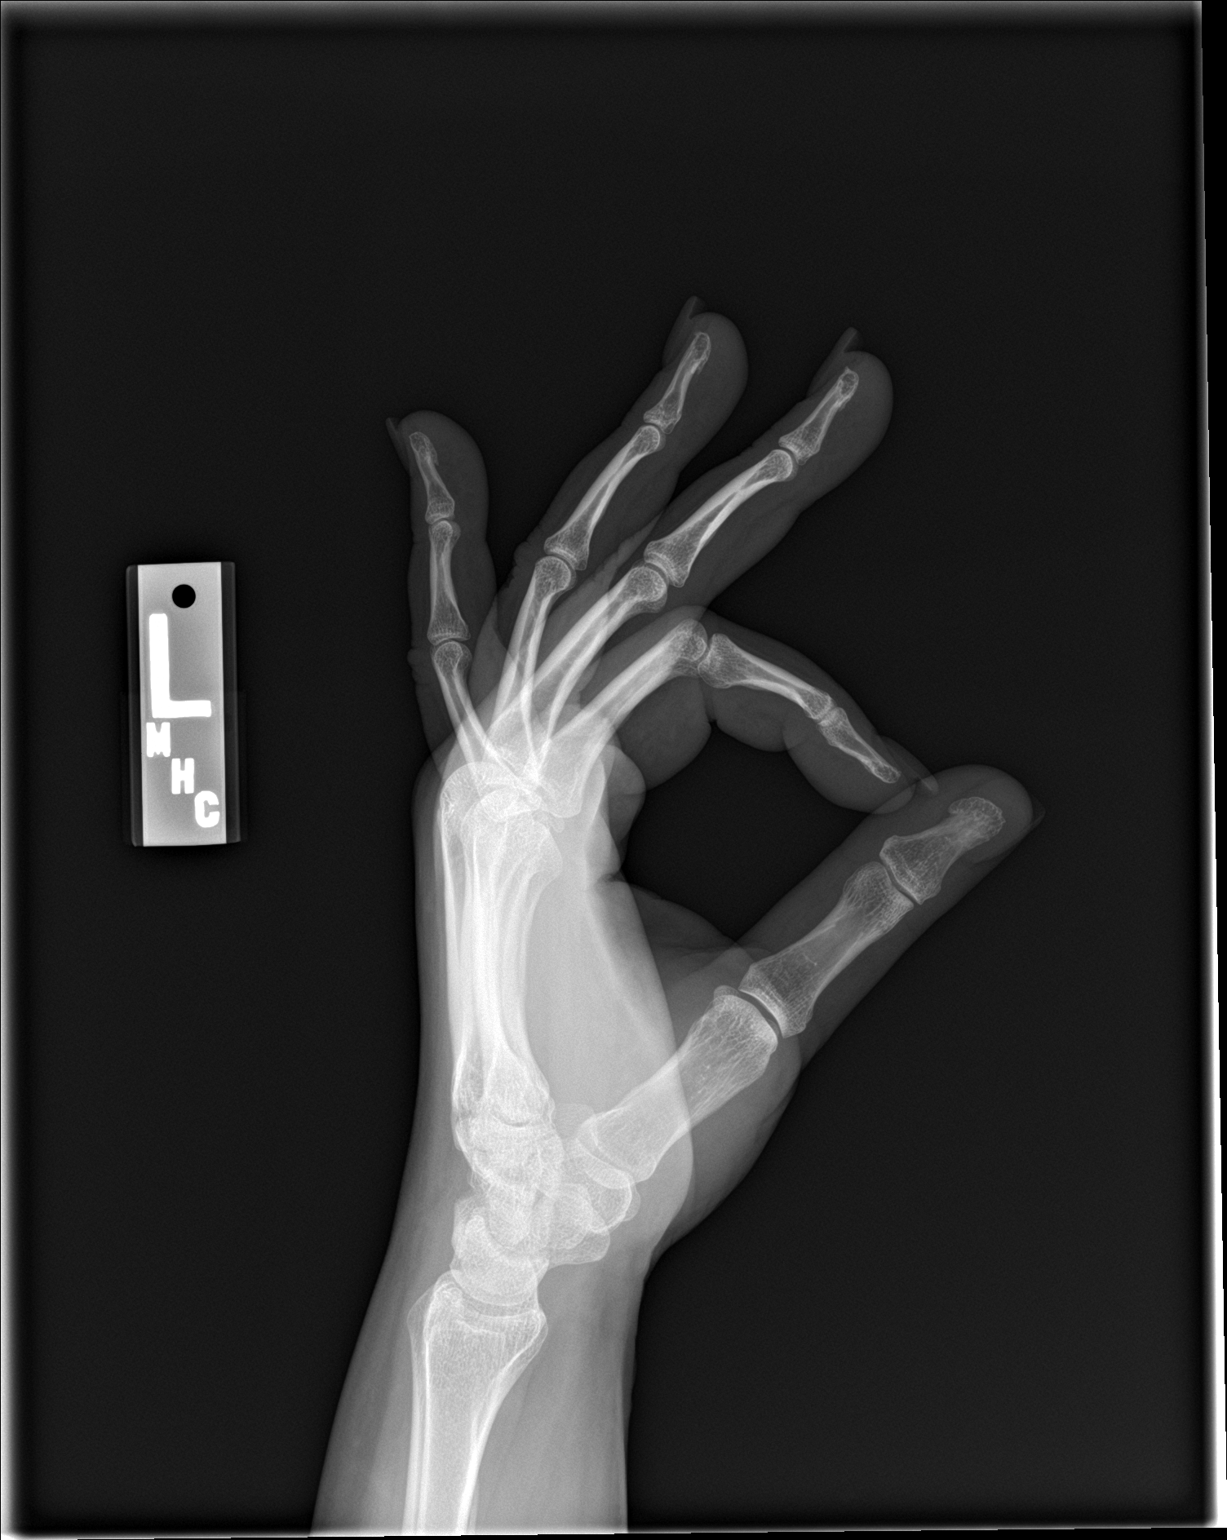

[3 of 3 positions shown; findings below may reference images not displayed]

FINDINGS: There is no evidence of fracture or dislocation. There is no
evidence of arthropathy or other focal bone abnormality. Soft
tissues are unremarkable.
IMPRESSION: Negative.

## 2023-05-08 ENCOUNTER — Emergency Department (HOSPITAL_COMMUNITY): Payer: Medicaid Other

## 2023-05-08 ENCOUNTER — Other Ambulatory Visit: Payer: Self-pay

## 2023-05-08 ENCOUNTER — Encounter (HOSPITAL_COMMUNITY): Payer: Self-pay | Admitting: *Deleted

## 2023-05-08 ENCOUNTER — Emergency Department (HOSPITAL_COMMUNITY)
Admission: EM | Admit: 2023-05-08 | Discharge: 2023-05-08 | Disposition: A | Discharge status: Home / Self Care | Payer: Medicaid Other | Attending: Emergency Medicine | Admitting: Emergency Medicine

## 2023-05-08 DIAGNOSIS — R1031 Right lower quadrant pain: Secondary | ICD-10-CM | POA: Diagnosis present

## 2023-05-08 DIAGNOSIS — E860 Dehydration: Secondary | ICD-10-CM | POA: Insufficient documentation

## 2023-05-08 DIAGNOSIS — E876 Hypokalemia: Secondary | ICD-10-CM | POA: Insufficient documentation

## 2023-05-08 DIAGNOSIS — N12 Tubulo-interstitial nephritis, not specified as acute or chronic: Secondary | ICD-10-CM | POA: Diagnosis not present

## 2023-05-08 LAB — URINALYSIS, ROUTINE W REFLEX MICROSCOPIC
Bilirubin Urine: NEGATIVE
Glucose, UA: NEGATIVE mg/dL
Hgb urine dipstick: NEGATIVE
Ketones, ur: 80 mg/dL — AB
Nitrite: NEGATIVE
Protein, ur: 30 mg/dL — AB
Specific Gravity, Urine: 1.025 (ref 1.005–1.030)
pH: 5 (ref 5.0–8.0)

## 2023-05-08 LAB — COMPREHENSIVE METABOLIC PANEL
ALT: 17 U/L (ref 0–44)
AST: 19 U/L (ref 15–41)
Albumin: 4.5 g/dL (ref 3.5–5.0)
Alkaline Phosphatase: 78 U/L (ref 38–126)
Anion gap: 12 (ref 5–15)
BUN: 21 mg/dL — ABNORMAL HIGH (ref 6–20)
CO2: 16 mmol/L — ABNORMAL LOW (ref 22–32)
Calcium: 9.2 mg/dL (ref 8.9–10.3)
Chloride: 106 mmol/L (ref 98–111)
Creatinine, Ser: 0.82 mg/dL (ref 0.44–1.00)
GFR, Estimated: >60 mL/min (ref >60)
Glucose, Bld: 67 mg/dL — ABNORMAL LOW (ref 70–99)
Potassium: 3.3 mmol/L — ABNORMAL LOW (ref 3.5–5.1)
Sodium: 134 mmol/L — ABNORMAL LOW (ref 135–145)
Total Bilirubin: 1.6 mg/dL — ABNORMAL HIGH (ref 0.3–1.2)
Total Protein: 7.8 g/dL (ref 6.5–8.1)

## 2023-05-08 LAB — LIPASE, BLOOD: Lipase: 31 U/L (ref 11–51)

## 2023-05-08 LAB — CBC WITH DIFFERENTIAL/PLATELET
Abs Immature Granulocytes: 0.03 10*3/uL (ref 0.00–0.07)
Basophils Absolute: 0.1 10*3/uL (ref 0.0–0.1)
Basophils Relative: 1 %
Eosinophils Absolute: 0.1 10*3/uL (ref 0.0–0.5)
Eosinophils Relative: 1 %
HCT: 40.2 % (ref 36.0–46.0)
Hemoglobin: 13.9 g/dL (ref 12.0–15.0)
Immature Granulocytes: 0 %
Lymphocytes Relative: 22 %
Lymphs Abs: 2 10*3/uL (ref 0.7–4.0)
MCH: 29.3 pg (ref 26.0–34.0)
MCHC: 34.6 g/dL (ref 30.0–36.0)
MCV: 84.6 fL (ref 80.0–100.0)
Monocytes Absolute: 0.7 10*3/uL (ref 0.1–1.0)
Monocytes Relative: 8 %
Neutro Abs: 6.3 10*3/uL (ref 1.7–7.7)
Neutrophils Relative %: 68 %
Platelets: 260 10*3/uL (ref 150–400)
RBC: 4.75 MIL/uL (ref 3.87–5.11)
RDW: 12.7 % (ref 11.5–15.5)
WBC: 9.1 10*3/uL (ref 4.0–10.5)
nRBC: 0 % (ref 0.0–0.2)

## 2023-05-08 LAB — HCG, SERUM, QUALITATIVE: Preg, Serum: NEGATIVE

## 2023-05-08 MED ORDER — CEFDINIR 300 MG PO CAPS: 300.0000 mg | ORAL_CAPSULE | Freq: Two times a day (BID) | ORAL | 0 refills | Status: DC

## 2023-05-08 MED ORDER — ONDANSETRON HCL 4 MG PO TABS: 4.0000 mg | ORAL_TABLET | Freq: Four times a day (QID) | ORAL | 0 refills | Status: DC

## 2023-05-08 MED ORDER — SODIUM CHLORIDE (PF) 0.9 % IJ SOLN
INTRAMUSCULAR | Status: AC
  Filled 2023-05-08: qty 50

## 2023-05-08 MED ORDER — IOHEXOL 300 MG/ML  SOLN
100.0000 mL | Freq: Once | INTRAMUSCULAR | Status: AC | PRN
  Administered 2023-05-08: 100 mL via INTRAVENOUS

## 2023-05-08 MED ORDER — HYDROMORPHONE HCL 1 MG/ML IJ SOLN
1.0000 mg | Freq: Once | INTRAMUSCULAR | Status: AC
  Administered 2023-05-08: 1 mg via INTRAVENOUS
  Filled 2023-05-08: qty 1

## 2023-05-08 MED ORDER — SODIUM CHLORIDE 0.9 % IV BOLUS
1000.0000 mL | Freq: Once | INTRAVENOUS | Status: AC
  Administered 2023-05-08: 1000 mL via INTRAVENOUS

## 2023-05-08 MED ORDER — ONDANSETRON HCL 4 MG/2ML IJ SOLN
4.0000 mg | Freq: Once | INTRAMUSCULAR | Status: AC
  Administered 2023-05-08: 4 mg via INTRAVENOUS
  Filled 2023-05-08: qty 2

## 2023-05-08 MED ORDER — SODIUM CHLORIDE 0.9 % IV SOLN
1.0000 g | Freq: Once | INTRAVENOUS | Status: AC
  Administered 2023-05-08: 1 g via INTRAVENOUS
  Filled 2023-05-08: qty 10

## 2023-05-08 MED ORDER — POTASSIUM CHLORIDE CRYS ER 20 MEQ PO TBCR
40.0000 meq | EXTENDED_RELEASE_TABLET | Freq: Once | ORAL | Status: AC
  Administered 2023-05-08: 40 meq via ORAL
  Filled 2023-05-08: qty 2

## 2023-05-08 NOTE — ED Provider Notes (Signed)
[place] EMERGENCY DEPARTMENT AT [facility] Provider Note   CSN: [id number] Arrival date & time: 05/08/23  1439     History  Chief Complaint  Patient presents with   Abdominal Pain    [name] is a 37 y.o. female with past medical history of anxiety, HPV who presents with concern for sudden onset right flank/right lower quadrant pain beginning this morning.  Patient rates pain 7/10, endorses some nausea but no vomiting.  She endorses dysuria, pain worse at the end of urination for the last couple of weeks but has not sought any treatment for it.  Not taking any antibiotics.  She denies any diarrhea, blood in stool.  She denies any fever or chills.   Abdominal Pain      Home Medications Prior to Admission medications   Medication Sig Start Date End Date Taking? Authorizing Provider  cefdinir (OMNICEF) 300 MG capsule Take 1 capsule (300 mg total) by mouth 2 (two) times daily. 05/08/23  Yes Shalik Sanfilippo H, PA-C  ondansetron (ZOFRAN) 4 MG tablet Take 1 tablet (4 mg total) by mouth every 6 (six) hours. 05/08/23  Yes Etherine Mackowiak H, PA-C  benzonatate (TESSALON PERLES) 100 MG capsule Take 1 capsule (100 mg total) by mouth 3 (three) times daily as needed for cough (Take 1-2 per dose). 07/09/15   Menshew, Charlesetta Ivory, PA-C  fluticasone (FLONASE) 50 MCG/ACT nasal spray Place 1 spray into both nostrils daily. 07/09/15   Menshew, Charlesetta Ivory, PA-C  medroxyPROGESTERone (PROVERA) 10 MG tablet Take 1 tablet (10 mg total) by mouth daily. 02/27/15   Marny Lowenstein, PA-C  naproxen (NAPROSYN) 500 MG tablet Take 1 tablet (500 mg total) by mouth 2 (two) times daily with a meal. 10/10/20   Wallis Bamberg, PA-C  omeprazole (PRILOSEC) 20 MG capsule Take 1 capsule (20 mg total) by mouth daily. 01/15/21   Terald Sleeper, MD  ondansetron (ZOFRAN-ODT) 4 MG disintegrating tablet Take 1 tablet (4 mg total) by mouth every 8 (eight) hours as needed for nausea or vomiting. 01/22/22    Placido Sou, PA-C  oxyCODONE-acetaminophen (PERCOCET/ROXICET) 5-325 MG tablet Take 1 tablet by mouth every 8 (eight) hours as needed for severe pain. 01/22/22   Placido Sou, PA-C  sucralfate (CARAFATE) 1 g tablet Take 1 tablet (1 g total) by mouth 4 (four) times daily -  with meals and at bedtime. 01/15/21 02/14/21  Terald Sleeper, MD      Allergies    Cocoa butter, Chocolate, and Codeine    Review of Systems   Review of Systems  Gastrointestinal:  Positive for abdominal pain.  All other systems reviewed and are negative.   Physical Exam Updated Vital Signs BP 94/61   Pulse (!) 58   Temp 98.4 F (36.9 C) (Oral)   Resp 18   Wt 74.8 kg   LMP 04/21/2023 (Approximate)   SpO2 98%   BMI 32.22 kg/m  Physical Exam Vitals and nursing note reviewed.  Constitutional:      General: She is not in acute distress.    Appearance: Normal appearance.  HENT:     Head: Normocephalic and atraumatic.  Eyes:     General:        Right eye: No discharge.        Left eye: No discharge.  Cardiovascular:     Rate and Rhythm: Normal rate and regular rhythm.     Heart sounds: No murmur heard.    No friction  rub. No gallop.  Pulmonary:     Effort: Pulmonary effort is normal.     Breath sounds: Normal breath sounds.  Abdominal:     General: Bowel sounds are normal.     Palpations: Abdomen is soft.     Comments: Focal ttp in rlq radiating to right flank, no rebound, rigidity, some guarding  Skin:    General: Skin is warm and dry.     Capillary Refill: Capillary refill takes less than 2 seconds.  Neurological:     Mental Status: She is alert and oriented to person, place, and time.  Psychiatric:        Mood and Affect: Mood normal.        Behavior: Behavior normal.     ED Results / Procedures / Treatments   Labs (all labs ordered are listed, but only abnormal results are displayed) Labs Reviewed  COMPREHENSIVE METABOLIC PANEL - Abnormal; Notable for the following components:       Result Value   Sodium 134 (*)    Potassium 3.3 (*)    CO2 16 (*)    Glucose, Bld 67 (*)    BUN 21 (*)    Total Bilirubin 1.6 (*)    All other components within normal limits  URINALYSIS, ROUTINE W REFLEX MICROSCOPIC - Abnormal; Notable for the following components:   Color, Urine AMBER (*)    APPearance HAZY (*)    Ketones, ur 80 (*)    Protein, ur 30 (*)    Leukocytes,Ua SMALL (*)    Bacteria, UA RARE (*)    All other components within normal limits  LIPASE, BLOOD  HCG, SERUM, QUALITATIVE  CBC WITH DIFFERENTIAL/PLATELET    EKG None  Radiology CT ABDOMEN PELVIS W CONTRAST  Result Date: 05/08/2023 CLINICAL DATA:  Abdominal pain, acute, nonlocalized RLQ pain, rebound tenderness, and nausea. Onset this am. Describes as radiates to R flank and has dark malodorous urine. Ongoing for 2-3 week. Denies fever, vomiting or diarrhea. EXAM: CT ABDOMEN AND PELVIS WITH CONTRAST TECHNIQUE: Multidetector CT imaging of the abdomen and pelvis was performed using the standard protocol following bolus administration of intravenous contrast. RADIATION DOSE REDUCTION: This exam was performed according to the departmental dose-optimization program which includes automated exposure control, adjustment of the mA and/or kV according to patient size and/or use of iterative reconstruction technique. CONTRAST:  OMNIPAQUE IOHEXOL 300 MG/ML  SOLN COMPARISON:  CT abdomen pelvis 01/15/2021 FINDINGS: Lower chest: No acute abnormality. Hepatobiliary: Vague perfusion variant along the left hepatic lobe (2:14). No focal liver abnormality. No gallstones, gallbladder wall thickening, or pericholecystic fluid. No biliary dilatation. Pancreas: No focal lesion. Normal pancreatic contour. No surrounding inflammatory changes. No main pancreatic ductal dilatation. Spleen: Normal in size without focal abnormality. Adrenals/Urinary Tract: No adrenal nodule bilaterally. Bilateral kidneys enhance symmetrically. Fluid density  lesion within left kidney likely represents a simple renal cyst. Simple renal cysts, in the absence of clinically indicated signs/symptoms, require no independent follow-up. No hydronephrosis. No hydroureter. Question vague perivesicular fat stranding. Otherwise the urinary bladder is unremarkable. Stomach/Bowel: Stomach is within normal limits. No evidence of bowel wall thickening or dilatation. Appendix appears normal. Vascular/Lymphatic: No abdominal aorta or iliac aneurysm. No abdominal, pelvic, or inguinal lymphadenopathy. Reproductive: Corpus luteum cyst within the left ovary-no further follow-up indicated. Uterus and bilateral adnexa are unremarkable. Other: No intraperitoneal free fluid. No intraperitoneal free gas. No organized fluid collection. Musculoskeletal: No abdominal wall hernia or abnormality. No suspicious lytic or blastic osseous lesions. No acute  displaced fracture. IMPRESSION: Question vague perivesicular fat stranding. Correlate with urinalysis. Otherwise no acute intra-abdominal or intrapelvic abnormality. Electronically Signed   By: Tish Frederickson M.D.   On: 05/08/2023 17:37    Procedures Procedures    Medications Ordered in ED Medications  potassium chloride SA (KLOR-CON M) CR tablet 40 mEq (has no administration in time range)  cefTRIAXone (ROCEPHIN) 1 g in sodium chloride 0.9 % 100 mL IVPB (has no administration in time range)  HYDROmorphone (DILAUDID) injection 1 mg (1 mg Intravenous Given 05/08/23 1609)  ondansetron (ZOFRAN) injection 4 mg (4 mg Intravenous Given 05/08/23 1609)  sodium chloride 0.9 % bolus 1,000 mL ( Intravenous Infusion Verify 05/08/23 1721)  iohexol (OMNIPAQUE) 300 MG/ML solution 100 mL (100 mLs Intravenous Contrast Given 05/08/23 1707)    ED Course/ Medical Decision Making/ A&P                             Medical Decision Making Amount and/or Complexity of Data Reviewed Labs: ordered. Radiology: ordered.  Risk Prescription drug  management.   This patient is a 37 y.o. female  who presents to the ED for concern of flank pain, dysuria.   Differential diagnoses prior to evaluation: The emergent differential diagnosis includes, but is not limited to,  AAA, renal artery/vein embolism/thrombosis, mesenteric ischemia, pyelonephritis, nephrolithiasis, cystitis, biliary colic, pancreatitis, perforated peptic ulcer, appendicitis, diverticulitis, bowel obstruction, Ectopic Pregnancy, PID/TOA, Ovarian cyst, Ovarian torsion. Overall most suspicious for pyelonephritis. This is not an exhaustive differential.   Past Medical History / Co-morbidities / Social History: Anxiety, HPV, cyclothymic disorder  Physical Exam: Physical exam performed. The pertinent findings include:  Focal ttp in rlq radiating to right flank, no rebound, rigidity, some guarding  Vital signs overall stable, no fever, tachycardia.  Lab Tests/Imaging studies: I personally interpreted labs/imaging and the pertinent results include: UA with some ketones, protein as well as leukocytes, bacteria, blood cells, there is some squamous contamination but given her dysuria, as well as bladder irritation and CT overall with high suspicion for acute UTI.  CMP notable for mild hypokalemia potassium 3.3.  Glucose is mildly low at 67, encouraged her to eat.  Tobler event will be checked slightly secondary to dehydration, overall with low clinical suspicion for an acute gallbladder pathology or liver pathology.  CBC unremarkable without leukocytosis, negative pregnancy test.  Normal lipase.  CT abdomen pelvis with contrast shows some fluid around the bladder, otherwise without acute intra-abdominal abnormality, no appendicitis noted. I agree with the radiologist interpretation.  Medications: I ordered medication including Dilaudid for pain, Zofran for nausea, fluid bolus for dehydration, potassium for hypokalemia.  I have reviewed the patients home medicines and have made  adjustments as needed.   Disposition: After consideration of the diagnostic results and the patients response to treatment, I feel that the symptoms pyelonephritis, treated with Rocephin, and discharged with cefdinir, encouraged PCP follow-up.Marland Kitchen   emergency department workup does not suggest an emergent condition requiring admission or immediate intervention beyond what has been performed at this time. The plan is: as above. The patient is safe for discharge and has been instructed to return immediately for worsening symptoms, change in symptoms or any other concerns.  Final Clinical Impression(s) / ED Diagnoses Final diagnoses:  Pyelonephritis    Rx / DC Orders ED Discharge Orders          Ordered    cefdinir (OMNICEF) 300 MG capsule  2 times daily  05/08/23 1755    ondansetron (ZOFRAN) 4 MG tablet  Every 6 hours        05/08/23 1755              [place] 05/08/23 1817    Tegeler, Canary Brim, MD 05/08/23 1843

## 2023-05-08 NOTE — ED Triage Notes (Signed)
BIB [facility] from Drs Office for RLQ pain, rebound tenderness, and nausea. Onset this am. Describes as radiates to R flank and has dark malodorous urine. Ongoing for 2-3 week. Denies fever, vomiting or diarrhea. Last BM yesterday. NSL by EMS. Pt declined zofran ("didn't need it"), and had some temporary relief with of fentanyl (total). VSS. Alert, NAD, calm, interactive.

## 2023-05-08 NOTE — Discharge Instructions (Signed)
Please use Tylenol or ibuprofen for pain.  You may use 600 mg ibuprofen every 6 hours or 1000 mg of Tylenol every 6 hours.  You may choose to alternate between the 2.  This would be most effective.  Not to exceed 4 g of Tylenol within 24 hours.  Not to exceed 3200 mg ibuprofen 24 hours.  You can use AZO for dysuria as well.  Please take the entire course of antibiotics that I have prescribed.

## 2023-12-19 ENCOUNTER — Other Ambulatory Visit: Payer: Self-pay

## 2023-12-19 ENCOUNTER — Emergency Department (HOSPITAL_BASED_OUTPATIENT_CLINIC_OR_DEPARTMENT_OTHER)
Admission: EM | Admit: 2023-12-19 | Discharge: 2023-12-19 | Disposition: A | Discharge status: Home / Self Care | Payer: Medicaid Other | Attending: Emergency Medicine | Admitting: Emergency Medicine

## 2023-12-19 ENCOUNTER — Emergency Department (HOSPITAL_BASED_OUTPATIENT_CLINIC_OR_DEPARTMENT_OTHER): Payer: Medicaid Other

## 2023-12-19 ENCOUNTER — Encounter (HOSPITAL_BASED_OUTPATIENT_CLINIC_OR_DEPARTMENT_OTHER): Payer: Self-pay | Admitting: Emergency Medicine

## 2023-12-19 DIAGNOSIS — M542 Cervicalgia: Secondary | ICD-10-CM | POA: Diagnosis present

## 2023-12-19 DIAGNOSIS — M25511 Pain in right shoulder: Secondary | ICD-10-CM | POA: Insufficient documentation

## 2023-12-19 DIAGNOSIS — M25512 Pain in left shoulder: Secondary | ICD-10-CM | POA: Insufficient documentation

## 2023-12-19 MED ORDER — METHOCARBAMOL 500 MG PO TABS: 1000.0000 mg | ORAL_TABLET | Freq: Four times a day (QID) | ORAL | 0 refills | Status: AC | PRN

## 2023-12-19 MED ORDER — MELOXICAM 7.5 MG PO TABS: 7.5000 mg | ORAL_TABLET | Freq: Every day | ORAL | 0 refills | Status: AC

## 2023-12-19 NOTE — ED Provider Notes (Signed)
 [facility] Provider Note   CSN: [id number] Arrival date & time: 12/19/23  1128     History  Chief Complaint  Patient presents with   Neck Pain    [name] is a 38 y.o. female.  Patient presents to the emergency department for evaluation of neck and upper back pain.  Symptoms have been present for about 2 months.  She reports pain in the posterior neck causing a headache as well as a burning pain in her shoulder area.  She states that yesterday she had trouble taking her gas cap off and needed to ask for help because when she tried to turn the cap, she had pain in her shoulder and upper back area.  No lower extremity symptoms.  She has tried Tylenol, naproxen, ibuprofen.  She took a dose of gabapentin from a friend, but stated that she had side effects from this.  She reports that the pain has been worse over the past couple of weeks after "popping" her neck.  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       Home Medications Prior to Admission medications   Medication Sig Start Date End Date Taking? Authorizing Provider  benzonatate (TESSALON PERLES) 100 MG capsule Take 1 capsule (100 mg total) by mouth 3 (three) times daily as needed for cough (Take 1-2 per dose). 07/09/15   Menshew, Charlesetta Ivory, PA-C  cefdinir (OMNICEF) 300 MG capsule Take 1 capsule (300 mg total) by mouth 2 (two) times daily. 05/08/23   Prosperi, Christian H, PA-C  fluticasone (FLONASE) 50 MCG/ACT nasal spray Place 1 spray into both nostrils daily. 07/09/15   Menshew, Charlesetta Ivory, PA-C  medroxyPROGESTERone (PROVERA) 10 MG tablet Take 1 tablet (10 mg total) by mouth daily. 02/27/15   Marny Lowenstein, PA-C  naproxen (NAPROSYN) 500 MG tablet Take 1 tablet (500 mg total) by mouth 2 (two) times daily with a meal. 10/10/20   Wallis Bamberg, PA-C  omeprazole (PRILOSEC) 20 MG capsule Take 1 capsule (20 mg total) by mouth daily. 01/15/21   Terald Sleeper, MD  ondansetron (ZOFRAN) 4 MG tablet Take 1 tablet (4 mg total) by mouth every 6 (six) hours. 05/08/23   Prosperi, Christian H, PA-C  ondansetron (ZOFRAN-ODT) 4 MG disintegrating tablet Take 1 tablet (4 mg total) by mouth every 8 (eight) hours as needed for nausea or vomiting. 01/22/22   Placido Sou, PA-C  oxyCODONE-acetaminophen (PERCOCET/ROXICET) 5-325 MG tablet Take 1 tablet by mouth every 8 (eight) hours as needed for severe pain. 01/22/22   Placido Sou, PA-C  sucralfate (CARAFATE) 1 g tablet Take 1 tablet (1 g total) by mouth 4 (four) times daily -  with meals and at bedtime. 01/15/21 02/14/21  Terald Sleeper, MD      Allergies    Cocoa butter, Chocolate, and Codeine    Review of Systems   Review of Systems  Physical Exam Updated Vital Signs BP 128/86   Pulse 96   Temp (!) 97.1 F (36.2 C)   Resp 16   Ht 5' (1.524 m)   Wt 68 kg   LMP 11/18/2023   SpO2 100%   BMI 29.29 kg/m  Physical Exam Vitals and nursing note reviewed.  Constitutional:      General: She is not in acute distress.    Appearance: She is well-developed.  HENT:     Head: Normocephalic and atraumatic.  Right Ear: External ear normal.     Left Ear: External ear normal.     Nose: Nose normal.  Eyes:     Conjunctiva/sclera: Conjunctivae normal.  Cardiovascular:     Rate and Rhythm: Normal rate and regular rhythm.     Heart sounds: No murmur heard. Pulmonary:     Effort: No respiratory distress.     Breath sounds: No wheezing, rhonchi or rales.  Abdominal:     Palpations: Abdomen is soft.     Tenderness: There is no abdominal tenderness. There is no guarding or rebound.  Musculoskeletal:     Right shoulder: Tenderness present. Normal range of motion.     Left shoulder: Tenderness present. Normal range of motion.     Right elbow: Normal range of motion.     Left elbow: Normal range of motion.     Right wrist: Normal range of motion.     Left wrist: Normal range of motion.      Cervical back: Normal range of motion and neck supple. Spasms and tenderness present. Normal range of motion.     Thoracic back: Spasms and tenderness present. Normal range of motion.     Right lower leg: No edema.     Left lower leg: No edema.  Skin:    General: Skin is warm and dry.     Findings: No rash.  Neurological:     General: No focal deficit present.     Mental Status: She is alert. Mental status is at baseline.     Motor: No weakness.     Comments: Strength is normal in the upper extremities.  Patient does look uncomfortable when she externally rotates her shoulders.  Psychiatric:        Mood and Affect: Mood normal.     ED Results / Procedures / Treatments   Labs (all labs ordered are listed, but only abnormal results are displayed) Labs Reviewed - No data to display  EKG None  Radiology No results found.  Procedures Procedures    Medications Ordered in ED Medications - No data to display  ED Course/ Medical Decision Making/ A&P    Patient seen and examined. History obtained directly from patient.   Labs/EKG: None ordered  Imaging: Ordered x-ray of the C-spine  Medications/Fluids: None ordered  Most recent vital signs reviewed and are as follows: BP 128/86   Pulse 96   Temp (!) 97.1 F (36.2 C)   Resp 16   Ht 5' (1.524 m)   Wt 68 kg   LMP 11/18/2023   SpO2 100%   BMI 29.29 kg/m   Initial impression: Musculoskeletal pain in the neck and upper back, low concern for central cord etiology, no lower extremity symptoms.  1:49 PM Reassessment performed. Patient appears stable.  Imaging personally visualized and interpreted including: Cervical spine x-ray, agree negative.  Reviewed pertinent lab work and imaging with patient at bedside. Questions answered.   Most current vital signs reviewed and are as follows: BP 128/86   Pulse 96   Temp (!) 97.1 F (36.2 C)   Resp 16   Ht 5' (1.524 m)   Wt 68 kg   LMP 11/18/2023   SpO2 100%   BMI  29.29 kg/m   Plan: Discharge to home.   Prescriptions written for: Meloxicam, methocarbamol  Patient counseled on proper use of muscle relaxant medication.  They were told not to drink alcohol, drive any vehicle, or do any dangerous activities while taking this medication.  Patient verbalized understanding.  Other home care instructions discussed: Rest, ice/heat, no heavy lifting pushing or pulling  ED return instructions discussed: New or worsening symptoms worsened  Follow-up instructions discussed: Patient encouraged to follow-up with their PCP or sports medicine referral in 7 days.                                 Medical Decision Making Amount and/or Complexity of Data Reviewed Radiology: ordered.   Patient with cervical spine pain and shoulder pain with some burning.  No lower extremity symptoms to suggest central cord syndrome.  X-rays are negative.  She has normal strength in her upper extremities.  Suspect some element of overuse from her job.  Will trial NSAIDs, muscle relaxers.  Sports medicine follow-up referral given and encouraged.  No focal neurosymptoms which would require transfer for MRI today.        Final Clinical Impression(s) / ED Diagnoses Final diagnoses:  Cervical pain    Rx / DC Orders ED Discharge Orders          Ordered    meloxicam (MOBIC) 7.5 MG tablet  Daily        12/19/23 1348    methocarbamol (ROBAXIN) 500 MG tablet  Every 6 hours PRN        12/19/23 1348              Renne Crigler, PA-C 12/19/23 1351    Franne Forts, DO 12/23/23 2158

## 2023-12-19 NOTE — Discharge Instructions (Signed)
 Please read and follow all provided instructions.  Your diagnoses today include:  1. Cervical pain     Tests performed today include: An x-ray of the affected area - does NOT show any broken bones Vital signs. See below for your results today.   Medications prescribed:  Meloxicam - anti-inflammatory pain medication  You have been prescribed an anti-inflammatory medication or NSAID. Take with food. Do not take aspirin, ibuprofen, or naproxen if taking this medication. Take smallest effective dose for the shortest duration needed for your pain. Stop taking if you experience stomach pain or vomiting.   Robaxin (methocarbamol) - muscle relaxer medication  DO NOT drive or perform any activities that require you to be awake and alert because this medicine can make you drowsy.   Take any prescribed medications only as directed.  Home care instructions:  Follow any educational materials contained in this packet Follow R.I.C.E. Protocol: R - rest your injury  I  - use ice on injury without applying directly to skin C - compress injury with bandage or splint E - elevate the injury as much as possible  Follow-up instructions: Please follow-up with your primary care provider or the provided orthopedic physician (bone specialist) if you continue to have significant pain in 1 week. In this case you may have a more severe injury that requires further care.   Return instructions:  Please return if your fingers are numb or tingling, appear gray or blue, or you have severe pain (also elevate the arm and loosen splint or wrap if you were given one) Please return to the Emergency Department if you experience worsening symptoms.  Please return if you have any other emergent concerns.  Additional Information:  Your vital signs today were: BP 128/86   Pulse 96   Temp (!) 97.1 F (36.2 C)   Resp 16   Ht 5' (1.524 m)   Wt 68 kg   LMP 11/18/2023   SpO2 100%   BMI 29.29 kg/m  If your blood  pressure (BP) was elevated above 135/85 this visit, please have this repeated by your doctor within one month. --------------

## 2023-12-19 NOTE — ED Triage Notes (Signed)
 Pt w/ posterior neck pain x 2 months; no injury, but hurts worse when she tries to "pop" it

## 2023-12-19 NOTE — ED Notes (Signed)
 Patient transported to X-ray

## 2023-12-25 ENCOUNTER — Ambulatory Visit: Payer: Medicaid Other | Admitting: Family Medicine

## 2023-12-25 NOTE — Progress Notes (Deleted)
 CHIEF COMPLAINT: No chief complaint on file.  _____________________________________________________________ SUBJECTIVE  HPI  Pt is a 38 y.o. female here for evaluation of  Ongoing for *** Inciting event: Primarily located   Radiating Numbness/tingling Catching/locking *** Exacerbated by Therapies tried so far:  Works as Psychiatric nurse***   ------------------------------------------------------------------------------------------------------ Past Medical History:  Diagnosis Date   Anxiety    Chlamydia 11/04/2007   Cyclothymic disorder    HPV (human papilloma virus) infection    Vaginal Pap smear, abnormal     No past surgical history on file.    Outpatient Encounter Medications as of 12/25/2023  Medication Sig   meloxicam (MOBIC) 7.5 MG tablet Take 1 tablet (7.5 mg total) by mouth daily.   methocarbamol (ROBAXIN) 500 MG tablet Take 2 tablets (1,000 mg total) by mouth every 6 (six) hours as needed for muscle spasms.   No facility-administered encounter medications on file as of 12/25/2023.    ------------------------------------------------------------------------------------------------------  _____________________________________________________________ OBJECTIVE  PHYSICAL EXAM  There were no vitals filed for this visit. There is no height or weight on file to calculate BMI.   reviewed  General: A+Ox3, no acute distress, well-nourished, appropriate affect CV: pulses 2+ regular, nondiaphoretic, no peripheral edema, cap refill <2sec Lungs: no audible wheezing, non-labored breathing, bilateral chest rise/fall, nontachypneic Skin: warm, well-perfused, non-icteric, no susp lesions or rashes Neuro: *** Sensation intact, muscle tone wnl, no atrophy Psych: no signs of depression or anxiety MSK: ***      _____________________________________________________________ ASSESSMENT/PLAN There are no diagnoses linked to this encounter.      Electronically signed by:  [facility] Forts, MD 12/25/2023 7:45 AM
